# Patient Record
Sex: Male | Born: 1965 | Race: White | Hispanic: No | Marital: Married
Health system: Midwestern US, Academic
[De-identification: ages and names within clinical notes are randomized; demographics above are authoritative.]

## PROBLEM LIST (undated history)

## (undated) DIAGNOSIS — J45909 Unspecified asthma, uncomplicated: Secondary | ICD-10-CM

## (undated) DIAGNOSIS — M19012 Primary osteoarthritis, left shoulder: Secondary | ICD-10-CM

## (undated) DIAGNOSIS — Z8719 Personal history of other diseases of the digestive system: Secondary | ICD-10-CM

## (undated) DIAGNOSIS — J189 Pneumonia, unspecified organism: Secondary | ICD-10-CM

## (undated) DIAGNOSIS — K219 Gastro-esophageal reflux disease without esophagitis: Secondary | ICD-10-CM

## (undated) HISTORY — PX: OTHER SURGICAL HISTORY: SHX169

## (undated) HISTORY — PX: APPENDECTOMY: SHX54

## (undated) HISTORY — PX: BACK SURGERY: SHX140

## (undated) HISTORY — PX: CHOLECYSTECTOMY: SHX55

---

## 2004-10-01 HISTORY — PX: OTHER SURGICAL HISTORY: SHX169

## 2012-10-01 HISTORY — PX: OTHER SURGICAL HISTORY: SHX169

## 2014-10-01 HISTORY — PX: OTHER SURGICAL HISTORY: SHX169

## 2017-03-25 ENCOUNTER — Other Ambulatory Visit: Payer: Self-pay | Admitting: Orthopedic Surgery

## 2017-03-25 DIAGNOSIS — S43432A Superior glenoid labrum lesion of left shoulder, initial encounter: Secondary | ICD-10-CM

## 2017-03-26 ENCOUNTER — Ambulatory Visit
Admission: RE | Admit: 2017-03-26 | Discharge: 2017-03-26 | Disposition: A | Discharge status: Home / Self Care | Payer: Federal, State, Local not specified - PPO | Source: Ambulatory Visit | Attending: Orthopedic Surgery | Admitting: Orthopedic Surgery

## 2017-03-26 DIAGNOSIS — S43432A Superior glenoid labrum lesion of left shoulder, initial encounter: Secondary | ICD-10-CM

## 2017-04-12 ENCOUNTER — Other Ambulatory Visit: Payer: Self-pay | Admitting: Gastroenterology

## 2017-04-12 DIAGNOSIS — K222 Esophageal obstruction: Secondary | ICD-10-CM

## 2017-04-15 ENCOUNTER — Other Ambulatory Visit: Payer: Self-pay | Admitting: Gastroenterology

## 2017-04-17 ENCOUNTER — Ambulatory Visit
Admission: RE | Admit: 2017-04-17 | Discharge: 2017-04-17 | Disposition: A | Discharge status: Home / Self Care | Payer: Federal, State, Local not specified - PPO | Source: Ambulatory Visit | Attending: Gastroenterology | Admitting: Gastroenterology

## 2017-04-17 DIAGNOSIS — K222 Esophageal obstruction: Secondary | ICD-10-CM

## 2017-04-18 ENCOUNTER — Encounter (HOSPITAL_COMMUNITY): Payer: Self-pay | Admitting: *Deleted

## 2017-04-22 ENCOUNTER — Ambulatory Visit (HOSPITAL_COMMUNITY): Payer: Federal, State, Local not specified - PPO | Admitting: Registered Nurse

## 2017-04-22 ENCOUNTER — Encounter (HOSPITAL_COMMUNITY)
Admission: RE | Disposition: A | Discharge status: Home / Self Care | Payer: Self-pay | Source: Ambulatory Visit | Attending: Gastroenterology

## 2017-04-22 ENCOUNTER — Ambulatory Visit (HOSPITAL_COMMUNITY)
Admission: RE | Admit: 2017-04-22 | Discharge: 2017-04-22 | Disposition: A | Discharge status: Home / Self Care | Payer: Federal, State, Local not specified - PPO | Source: Ambulatory Visit | Attending: Gastroenterology | Admitting: Gastroenterology

## 2017-04-22 ENCOUNTER — Encounter (HOSPITAL_COMMUNITY): Payer: Self-pay

## 2017-04-22 DIAGNOSIS — Z79899 Other long term (current) drug therapy: Secondary | ICD-10-CM | POA: Insufficient documentation

## 2017-04-22 DIAGNOSIS — M19012 Primary osteoarthritis, left shoulder: Secondary | ICD-10-CM | POA: Insufficient documentation

## 2017-04-22 DIAGNOSIS — K2960 Other gastritis without bleeding: Secondary | ICD-10-CM | POA: Insufficient documentation

## 2017-04-22 DIAGNOSIS — Z885 Allergy status to narcotic agent status: Secondary | ICD-10-CM | POA: Insufficient documentation

## 2017-04-22 DIAGNOSIS — K222 Esophageal obstruction: Secondary | ICD-10-CM | POA: Diagnosis not present

## 2017-04-22 DIAGNOSIS — K200 Eosinophilic esophagitis: Secondary | ICD-10-CM | POA: Diagnosis not present

## 2017-04-22 DIAGNOSIS — Z91010 Allergy to peanuts: Secondary | ICD-10-CM | POA: Diagnosis not present

## 2017-04-22 DIAGNOSIS — K219 Gastro-esophageal reflux disease without esophagitis: Secondary | ICD-10-CM | POA: Diagnosis not present

## 2017-04-22 DIAGNOSIS — K3189 Other diseases of stomach and duodenum: Secondary | ICD-10-CM | POA: Insufficient documentation

## 2017-04-22 DIAGNOSIS — Z91013 Allergy to seafood: Secondary | ICD-10-CM | POA: Diagnosis not present

## 2017-04-22 DIAGNOSIS — Z91048 Other nonmedicinal substance allergy status: Secondary | ICD-10-CM | POA: Insufficient documentation

## 2017-04-22 HISTORY — DX: Gastro-esophageal reflux disease without esophagitis: K21.9

## 2017-04-22 HISTORY — DX: Unspecified asthma, uncomplicated: J45.909

## 2017-04-22 HISTORY — PX: ESOPHAGOGASTRODUODENOSCOPY: SHX5428

## 2017-04-22 SURGERY — EGD (ESOPHAGOGASTRODUODENOSCOPY)
Anesthesia: Monitor Anesthesia Care

## 2017-04-22 MED ORDER — LIDOCAINE 2% (20 MG/ML) 5 ML SYRINGE
INTRAMUSCULAR | Status: AC
  Filled 2017-04-22: qty 5

## 2017-04-22 MED ORDER — SODIUM CHLORIDE 0.9 % IV SOLN: INTRAVENOUS | Status: DC

## 2017-04-22 MED ORDER — PROPOFOL 500 MG/50ML IV EMUL
INTRAVENOUS | Status: DC | PRN
  Administered 2017-04-22: 130 ug/kg/min via INTRAVENOUS

## 2017-04-22 MED ORDER — PROPOFOL 10 MG/ML IV BOLUS
INTRAVENOUS | Status: AC
  Filled 2017-04-22: qty 40

## 2017-04-22 MED ORDER — LACTATED RINGERS IV SOLN
INTRAVENOUS | Status: DC
  Administered 2017-04-22: 11:00:00 via INTRAVENOUS

## 2017-04-22 MED ORDER — PROPOFOL 10 MG/ML IV BOLUS
INTRAVENOUS | Status: DC | PRN
  Administered 2017-04-22 (×3): 20 mg via INTRAVENOUS

## 2017-04-22 MED ORDER — LIDOCAINE 2% (20 MG/ML) 5 ML SYRINGE
INTRAMUSCULAR | Status: DC | PRN
  Administered 2017-04-22: 100 mg via INTRAVENOUS

## 2017-04-22 NOTE — Op Note (Signed)
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 Estimated Blood Loss:     Estimated blood loss: none. Procedure:                Pre-Anesthesia Assessment:                           - Prior to the procedure, a History and Physical                            was performed, and patient medications and                            allergies were reviewed. The patient's tolerance of                            previous anesthesia was also reviewed. The risks                            and benefits of the procedure and the sedation                            options and risks were discussed with the patient.                            All questions were answered, and informed consent                            was obtained. Prior Anticoagulants: The patient has                            taken no previous anticoagulant or antiplatelet                            agents. ASA Grade Assessment: II - A patient  with                            mild systemic disease. After reviewing the risks                            and benefits, the patient was deemed in                            satisfactory condition to undergo the procedure.                           After obtaining informed consent, the endoscope was                            passed under direct vision. Throughout the                             procedure, the patient's blood pressure, pulse, and                            oxygen saturations were monitored continuously. The                            was introduced through the mouth, and advanced to                            the second part of duodenum. The upper GI endoscopy                            was accomplished without difficulty. The patient                            tolerated the procedure well. Scope In: Scope Out: Findings:      Mucosal changes including ringed esophagus, longitudinal furrows and       white plaques were found in the upper third of the esophagus, in the       middle third of the esophagus and in the lower third of the esophagus.       Esophageal findings were graded using the Eosinophilic Esophagitis       Endoscopic Reference Score (EoE-EREFS) as: Edema Grade 1 Present       (decreased clarity or absence of vascular markings), Rings Grade 2       Moderate (distinct rings that do not occlude passage of diagnostic 8-10       mm endoscope), Exudates Grade 1 Mild (scattered white lesions involving       less than 10 percent of the esophageal surface area), Furrows Grade 1       Present (vertical lines with or without visible depth) and Stricture       present. Biopsies were obtained from the proximal and distal esophagus       with cold forceps for histology of suspected eosinophilic esophagitis. A       TTS dilator was passed through the scope. Dilation with an 18-19-20 mm x       8 cm CRE balloon  dilator was performed to 18 mm and 19 mm. The dilation       site was examined following endoscope reinsertion and showed moderate       improvement in luminal narrowing.      Localized mildly erythematous mucosa without bleeding was found in the       gastric antrum. Biopsies were taken with a cold forceps for Helicobacter       pylori testing.      The cardia and gastric fundus were normal on retroflexion.      The examined  duodenum was normal. Impression:               - Esophageal mucosal changes consistent with                            eosinophilic esophagitis. Biopsied. Dilated.                           - Erythematous mucosa in the antrum. Biopsied.                           - Normal examined duodenum. Moderate Sedation:      Patient did not receive moderate sedation for this procedure, but       instead received monitored anesthesia care. Recommendation:           - Patient has a contact number available for                            emergencies. The signs and symptoms of potential                            delayed complications were discussed with the                            patient. Return to normal activities tomorrow.                            Written discharge instructions were provided to the                            patient.                           - Resume previous diet.                           - Continue present medications.                           - Await pathology results.                           - If biopsies compatible with EOE, will need to                            take PPI BID along with swallowed steroids. Procedure Code(s):        --- Professional ---  [id number], Esophagogastroduodenoscopy, flexible,                            transoral; with transendoscopic balloon dilation of                            esophagus (less than 30 mm diameter)                           43239, Esophagogastroduodenoscopy, flexible,                            transoral; with biopsy, single or multiple Diagnosis Code(s):        --- Professional ---                           K20.9, Esophagitis, unspecified                           K31.89, Other diseases of stomach and duodenum                           R13.10, Dysphagia, unspecified                           K22.2, Esophageal obstruction                           R93.3, Abnormal findings on diagnostic imaging of                             other parts of digestive tract CPT copyright 2016 [employer]. All rights reserved. The codes documented in this report are preliminary and upon coder review may  be revised to meet current compliance requirements. Ronnette Juniper, MD 04/22/2017 12:32:04 PM This report has been signed electronically. Number of Addenda: 0

## 2017-04-22 NOTE — Anesthesia Preprocedure Evaluation (Signed)
Anesthesia Evaluation  Patient identified by MRN, date of birth, ID band Patient awake    Reviewed: Allergy & Precautions, NPO status , Patient's Chart, lab work & pertinent test results  Airway Mallampati: II  TM Distance: >3 FB Neck ROM: Full    Dental no notable dental hx.    Pulmonary neg pulmonary ROS,    Pulmonary exam normal breath sounds clear to auscultation       Cardiovascular negative cardio ROS Normal cardiovascular exam Rhythm:Regular Rate:Normal     Neuro/Psych negative neurological ROS  negative psych ROS   GI/Hepatic Neg liver ROS, GERD  ,  Endo/Other  negative endocrine ROS  Renal/GU negative Renal ROS     Musculoskeletal  (+) Arthritis ,   Abdominal   Peds  Hematology negative hematology ROS (+)   Anesthesia Other Findings   Reproductive/Obstetrics negative OB ROS                             Anesthesia Physical Anesthesia Plan  ASA: II  Anesthesia Plan: MAC   Post-op Pain Management:    Induction: Intravenous  PONV Risk Score and Plan: 1 and Ondansetron, Treatment may vary due to age or medical condition and Propofol  Airway Management Planned:   Additional Equipment:   Intra-op Plan:   Post-operative Plan:   Informed Consent: I have reviewed the patients History and Physical, chart, labs and discussed the procedure including the risks, benefits and alternatives for the proposed anesthesia with the patient or authorized representative who has indicated his/her understanding and acceptance.   Dental advisory given  Plan Discussed with: CRNA  Anesthesia Plan Comments:         Anesthesia Quick Evaluation

## 2017-04-22 NOTE — Op Note (Signed)
EGD showed changes suggestive of eosinophilic esophagitis. Esophageal mucosa had whitish plaques, longitudinal furrows and ringed appearance. Biopsies were taken from different areas of esophagus to rule out eosinophilic esophagitis. At 38 cm and distal esophagus, obvious narrowing was noted, but the scope could be advanced into the gastric cavity with minimal resistance. This was dilated with a CRE balloon, 18 mm for a minute, 19 mm for 23 seconds, before noticing obvious mucosal tear and minimal bleeding. Biopsies were taken from antrum to rule out H. Pylori. Retroflexion appeared unremarkable. Duodenum appeared unremarkable.  If the biopsies are compatible with eosinophilic esophagitis, she will need PPI twice a day along with swallowed steroids.  Ronnette Juniper, M.D.

## 2017-04-22 NOTE — H&P (Signed)
Bobby Cole is an 51 y.o. male.   Chief Complaint: Dysphagia HPI: 51 year old male with history of esophageal stricture, multiple dilatations in the past in [place] and also in [place], presents with difficulty swallowing. He has been intermittently symptomatic for over 20 years. As history of hiatal hernia and gastroesophageal reflux disease. Used to be on swallowed steroids. Asked endoscopy was 2 years ago. 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  Past Medical History:  Diagnosis Date  . Asthma    exercise induced asthma no inhalers used  . DJD of left shoulder   . GERD (gastroesophageal reflux disease)     Past Surgical History:  Procedure Laterality Date  . APPENDECTOMY    . BACK SURGERY     cervical fusion C 4 thru C7  . CHOLECYSTECTOMY    . left acl repair    . left calf fasciotomy     compartment syndrome  . left forearm surgery for osteomyelitis    . left shoulder partial replacement Left 2006   hemicap  . left wrist carpel tunnel surgery    . left wrist fusion with traction    . lower back fusion L 4 to S 1   2016  . multipl e knee meniscus repairs Bilateral   . partial amputation right pinkie    . pectoral and long head bicep repair Right   . right hip femoral surgery  2014    History reviewed. No pertinent family history. Social History:  reports that he has never smoked. He has never used smokeless tobacco. He reports that he drinks alcohol. He reports that he does not use drugs.  Allergies:  Allergies  Allergen Reactions  . Fish Allergy Anaphylaxis  . Codeine Nausea Only    Medications Prior to Admission  Medication Sig Dispense Refill  . ibuprofen (ADVIL,MOTRIN) 200 MG tablet Take 400-800 mg by mouth every 6 (six) hours as needed for headache, mild pain or moderate pain.    Marland Kitchen omeprazole (PRILOSEC OTC) 20 MG tablet Take  20 mg by mouth 2 (two) times daily.    . pravastatin (PRAVACHOL) 40 MG tablet Take 40 mg by mouth every evening.  0  . testosterone cypionate (DEPOTESTOSTERONE CYPIONATE) 200 MG/ML injection Inject 200 mg into the muscle once a week. Saturdays.  0  . anastrozole (ARIMIDEX) 1 MG tablet Take 1 mg by mouth daily.  3    No results found for this or any previous visit (from the past 48 hour(s)). No results found.  ROS  All 10 systems were reviewed and there are no pertinent positives, except for what was mentioned in history of present illness.  Blood pressure (!) 159/93, pulse 75, temperature 97.6 F (36.4 C), temperature source Oral, resp. rate 14, height 5\' 11"  (1.803 m), weight 107.5 kg (237 lb), SpO2 100 %. Physical Exam  Gen.: Not in acute distress Lungs: Biateral vascular breath sound Heart: Regular rate rhythm Abdomen: Soft nondistended normoactive bowel sounds Neuro: Alert awake oriented 3    Assessment/Plan Dysphagia History of esophageal stricture History of eosinophilic esophagitis   EGD with dilatation, esophageal biopsies   Ronnette Juniper, MD 04/22/2017, 11:12 AM

## 2017-04-22 NOTE — Transfer of Care (Signed)
Immediate Anesthesia Transfer of Care Note  Patient: Bobby Cole  Procedure(s) Performed: Procedure(s): ESOPHAGOGASTRODUODENOSCOPY (EGD) (N/A)  Patient Location: PACU and Endoscopy Unit  Anesthesia Type:MAC  Level of Consciousness: awake, alert , oriented and patient cooperative  Airway & Oxygen Therapy: Patient Spontanous Breathing and Patient connected to nasal cannula oxygen  Post-op Assessment: Report given to RN, Post -op Vital signs reviewed and stable and Patient moving all extremities  Post vital signs: Reviewed and stable  Last Vitals:  Vitals:   04/22/17 1102  BP: (!) 159/93  Pulse: 75  Resp: 14  Temp: 36.4 C    Last Pain:  Vitals:   04/22/17 1102  TempSrc: Oral         Complications: No apparent anesthesia complications

## 2017-04-22 NOTE — Brief Op Note (Signed)
04/22/2017  12:32 PM  PATIENT:  Bobby Cole  51 y.o. male  PRE-OPERATIVE DIAGNOSIS:  Eosinophilic esophagitis  POST-OPERATIVE DIAGNOSIS:  esophageal dilation  PROCEDURE:  Procedure(s): ESOPHAGOGASTRODUODENOSCOPY (EGD) (N/A)  SURGEON:  Surgeon(s) and Role:    Ronnette Juniper, MD - Primary  PHYSICIAN ASSISTANT:   ASSISTANTS: none   ANESTHESIA:   MAC  EBL:  No intake/output data recorded.  BLOOD ADMINISTERED:none  DRAINS: none   LOCAL MEDICATIONS USED:  NONE  SPECIMEN:  Biopsy / Limited Resection  DISPOSITION OF SPECIMEN:  PATHOLOGY  COUNTS:  YES  TOURNIQUET:  * No tourniquets in log *  DICTATION: .Dragon Dictation  PLAN OF CARE: Discharge to home after PACU  PATIENT DISPOSITION:  PACU - hemodynamically stable.   Delay start of Pharmacological VTE agent (>24hrs) due to surgical blood loss or risk of bleeding: no

## 2017-04-22 NOTE — Discharge Instructions (Signed)

## 2017-04-23 NOTE — Anesthesia Postprocedure Evaluation (Addendum)
Anesthesia Post Note  Patient: Bobby Cole  Procedure(s) Performed: Procedure(s) (LRB): ESOPHAGOGASTRODUODENOSCOPY (EGD) (N/A)     Patient location during evaluation: PACU Anesthesia Type: MAC Level of consciousness: awake and alert Pain management: pain level controlled Vital Signs Assessment: post-procedure vital signs reviewed and stable Respiratory status: spontaneous breathing Cardiovascular status: stable Anesthetic complications: no    Last Vitals:  Vitals:   04/22/17 1240 04/22/17 1250  BP: (!) 145/87 (!) 142/81  Pulse: 74 82  Resp: 20 (!) 21  Temp:      Last Pain:  Vitals:   04/22/17 1235  TempSrc: Oral                 Nolon Nations

## 2017-04-25 ENCOUNTER — Encounter (HOSPITAL_COMMUNITY): Payer: Self-pay | Admitting: Gastroenterology

## 2017-04-26 ENCOUNTER — Other Ambulatory Visit: Payer: Self-pay | Admitting: Orthopedic Surgery

## 2017-04-26 ENCOUNTER — Encounter (HOSPITAL_COMMUNITY)
Admission: RE | Admit: 2017-04-26 | Discharge: 2017-04-26 | Disposition: A | Discharge status: Home / Self Care | Payer: Federal, State, Local not specified - PPO | Source: Ambulatory Visit | Attending: Orthopedic Surgery | Admitting: Orthopedic Surgery

## 2017-04-26 ENCOUNTER — Encounter (HOSPITAL_COMMUNITY): Payer: Self-pay

## 2017-04-26 DIAGNOSIS — Z01812 Encounter for preprocedural laboratory examination: Secondary | ICD-10-CM | POA: Insufficient documentation

## 2017-04-26 DIAGNOSIS — R1310 Dysphagia, unspecified: Secondary | ICD-10-CM | POA: Diagnosis not present

## 2017-04-26 HISTORY — DX: Pneumonia, unspecified organism: J18.9

## 2017-04-26 HISTORY — DX: Personal history of other diseases of the digestive system: Z87.19

## 2017-04-26 LAB — CBC
HEMATOCRIT: 46.6 % (ref 39.0–52.0)
HEMOGLOBIN: 15.9 g/dL (ref 13.0–17.0)
MCH: 30.8 pg (ref 26.0–34.0)
MCHC: 34.1 g/dL (ref 30.0–36.0)
MCV: 90.3 fL (ref 78.0–100.0)
PLATELETS: 187 10*3/uL (ref 150–400)
RBC: 5.16 MIL/uL (ref 4.22–5.81)
RDW: 14.1 % (ref 11.5–15.5)
WBC: 6.9 10*3/uL (ref 4.0–10.5)

## 2017-04-26 LAB — SURGICAL PCR SCREEN
MRSA, PCR: NEGATIVE
STAPHYLOCOCCUS AUREUS: NEGATIVE
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
## 2017-04-26 NOTE — Pre-Procedure Instructions (Signed)
    Bobby Cole  04/26/2017      [id number] - [place], [place] - [address] [place] [address] Phone: [phone] Fax: [phone]    Your procedure is scheduled on Tues., Aug 7  Report to [facility] Admitting at 8:45 A.M.  Call this number if you have problems the morning of surgery:  [phone]   Remember:  Do not eat food or drink liquids after midnight.  Take these medicines the morning of surgery with A SIP OF WATER : omeprazole (prilosec)                 1 week prior to surgery stop: advil, motrin, ibuprofen, aleve, BC Powders, , Goody's, herbal medicines and vitamins.   Do not wear jewelry.  Do not wear lotions, powders, or perfumes, or deoderant.  Do not shave 48 hours prior to surgery.  Men may shave face and neck.  Do not bring valuables to the hospital.  [facility] is not responsible for any belongings or valuables.  Contacts, dentures or bridgework may not be worn into surgery.  Leave your suitcase in the car.  After surgery it may be brought to your room.  For patients admitted to the hospital, discharge time will be determined by your treatment team.  Patients discharged the day of surgery will not be allowed to drive home.    Special instructions:  Review handouts  Please read over the following fact sheets that you were given. Coughing and Deep Breathing, MRSA Information and Surgical Site Infection Prevention

## 2017-05-07 ENCOUNTER — Inpatient Hospital Stay (HOSPITAL_COMMUNITY): Payer: Federal, State, Local not specified - PPO | Admitting: Anesthesiology

## 2017-05-07 ENCOUNTER — Encounter (HOSPITAL_COMMUNITY): Payer: Self-pay | Admitting: Anesthesiology

## 2017-05-07 ENCOUNTER — Encounter (HOSPITAL_COMMUNITY)
Admission: RE | Disposition: A | Discharge status: Home / Self Care | Payer: Self-pay | Source: Ambulatory Visit | Attending: Orthopedic Surgery

## 2017-05-07 ENCOUNTER — Inpatient Hospital Stay (HOSPITAL_COMMUNITY): Payer: Federal, State, Local not specified - PPO

## 2017-05-07 ENCOUNTER — Inpatient Hospital Stay (HOSPITAL_COMMUNITY)
Admission: RE | Admit: 2017-05-07 | Discharge: 2017-05-08 | DRG: 483 | Disposition: A | Discharge status: Home / Self Care | Payer: Federal, State, Local not specified - PPO | Source: Ambulatory Visit | Attending: Orthopedic Surgery | Admitting: Orthopedic Surgery

## 2017-05-07 DIAGNOSIS — K449 Diaphragmatic hernia without obstruction or gangrene: Secondary | ICD-10-CM | POA: Diagnosis present

## 2017-05-07 DIAGNOSIS — J45990 Exercise induced bronchospasm: Secondary | ICD-10-CM | POA: Diagnosis present

## 2017-05-07 DIAGNOSIS — Z91013 Allergy to seafood: Secondary | ICD-10-CM

## 2017-05-07 DIAGNOSIS — Z885 Allergy status to narcotic agent status: Secondary | ICD-10-CM

## 2017-05-07 DIAGNOSIS — M19012 Primary osteoarthritis, left shoulder: Secondary | ICD-10-CM | POA: Diagnosis present

## 2017-05-07 DIAGNOSIS — T8484XA Pain due to internal orthopedic prosthetic devices, implants and grafts, initial encounter: Secondary | ICD-10-CM | POA: Diagnosis present

## 2017-05-07 DIAGNOSIS — K219 Gastro-esophageal reflux disease without esophagitis: Secondary | ICD-10-CM | POA: Diagnosis present

## 2017-05-07 DIAGNOSIS — Y838 Other surgical procedures as the cause of abnormal reaction of the patient, or of later complication, without mention of misadventure at the time of the procedure: Secondary | ICD-10-CM | POA: Diagnosis present

## 2017-05-07 DIAGNOSIS — Z96619 Presence of unspecified artificial shoulder joint: Secondary | ICD-10-CM

## 2017-05-07 DIAGNOSIS — Z79899 Other long term (current) drug therapy: Secondary | ICD-10-CM | POA: Diagnosis not present

## 2017-05-07 HISTORY — PX: TOTAL SHOULDER REVISION: SHX6130

## 2017-05-07 HISTORY — DX: Primary osteoarthritis, left shoulder: M19.012

## 2017-05-07 SURGERY — REVISION, TOTAL ARTHROPLASTY, SHOULDER
Anesthesia: General | Site: Shoulder | Laterality: Left

## 2017-05-07 MED ORDER — DEXAMETHASONE SODIUM PHOSPHATE 10 MG/ML IJ SOLN
INTRAMUSCULAR | Status: AC
  Filled 2017-05-07: qty 1

## 2017-05-07 MED ORDER — ROCURONIUM BROMIDE 100 MG/10ML IV SOLN
INTRAVENOUS | Status: DC | PRN
  Administered 2017-05-07: 10 mg via INTRAVENOUS
  Administered 2017-05-07: 70 mg via INTRAVENOUS
  Administered 2017-05-07: 20 mg via INTRAVENOUS

## 2017-05-07 MED ORDER — ACETAMINOPHEN 650 MG RE SUPP: 650.0000 mg | Freq: Four times a day (QID) | RECTAL | Status: DC | PRN

## 2017-05-07 MED ORDER — FENTANYL CITRATE (PF) 250 MCG/5ML IJ SOLN
INTRAMUSCULAR | Status: AC
  Filled 2017-05-07: qty 5

## 2017-05-07 MED ORDER — CEFAZOLIN SODIUM-DEXTROSE 2-4 GM/100ML-% IV SOLN
INTRAVENOUS | Status: AC
  Filled 2017-05-07: qty 100

## 2017-05-07 MED ORDER — PRAVASTATIN SODIUM 40 MG PO TABS
40.0000 mg | ORAL_TABLET | Freq: Every evening | ORAL | Status: DC
  Administered 2017-05-07: 40 mg via ORAL
  Filled 2017-05-07: qty 1

## 2017-05-07 MED ORDER — OXYCODONE HCL 5 MG PO TABS: 5.0000 mg | ORAL_TABLET | ORAL | 0 refills | Status: AC | PRN

## 2017-05-07 MED ORDER — BUPIVACAINE HCL (PF) 0.25 % IJ SOLN
INTRAMUSCULAR | Status: AC
  Filled 2017-05-07: qty 30

## 2017-05-07 MED ORDER — ONDANSETRON HCL 4 MG/2ML IJ SOLN
INTRAMUSCULAR | Status: AC
  Filled 2017-05-07: qty 2

## 2017-05-07 MED ORDER — LACTATED RINGERS IV SOLN
INTRAVENOUS | Status: DC | PRN
  Administered 2017-05-07 (×2): via INTRAVENOUS

## 2017-05-07 MED ORDER — DOCUSATE SODIUM 100 MG PO CAPS
100.0000 mg | ORAL_CAPSULE | Freq: Two times a day (BID) | ORAL | Status: DC
  Administered 2017-05-07 – 2017-05-08 (×2): 100 mg via ORAL
  Filled 2017-05-07 (×2): qty 1

## 2017-05-07 MED ORDER — PROPOFOL 10 MG/ML IV BOLUS
INTRAVENOUS | Status: AC
  Filled 2017-05-07: qty 20

## 2017-05-07 MED ORDER — METHOCARBAMOL 500 MG PO TABS
500.0000 mg | ORAL_TABLET | Freq: Four times a day (QID) | ORAL | Status: DC | PRN
  Administered 2017-05-07 – 2017-05-08 (×2): 500 mg via ORAL
  Filled 2017-05-07 (×2): qty 1

## 2017-05-07 MED ORDER — ONDANSETRON HCL 4 MG/2ML IJ SOLN: 4.0000 mg | Freq: Once | INTRAMUSCULAR | Status: DC | PRN

## 2017-05-07 MED ORDER — METOCLOPRAMIDE HCL 5 MG/ML IJ SOLN: 5.0000 mg | Freq: Three times a day (TID) | INTRAMUSCULAR | Status: DC | PRN

## 2017-05-07 MED ORDER — ONDANSETRON HCL 4 MG PO TABS: 4.0000 mg | ORAL_TABLET | Freq: Four times a day (QID) | ORAL | Status: DC | PRN

## 2017-05-07 MED ORDER — SENNA 8.6 MG PO TABS
1.0000 | ORAL_TABLET | Freq: Two times a day (BID) | ORAL | Status: DC
  Administered 2017-05-08: 8.6 mg via ORAL
  Filled 2017-05-07: qty 1

## 2017-05-07 MED ORDER — DEXAMETHASONE SODIUM PHOSPHATE 10 MG/ML IJ SOLN
INTRAMUSCULAR | Status: DC | PRN
  Administered 2017-05-07: 10 mg via INTRAVENOUS

## 2017-05-07 MED ORDER — FENTANYL CITRATE (PF) 100 MCG/2ML IJ SOLN
100.0000 ug | Freq: Once | INTRAMUSCULAR | Status: AC
  Administered 2017-05-07: 100 ug via INTRAVENOUS

## 2017-05-07 MED ORDER — LIDOCAINE 2% (20 MG/ML) 5 ML SYRINGE
INTRAMUSCULAR | Status: AC
  Filled 2017-05-07: qty 5

## 2017-05-07 MED ORDER — PHENOL 1.4 % MT LIQD: 1.0000 | OROMUCOSAL | Status: DC | PRN

## 2017-05-07 MED ORDER — CEFAZOLIN SODIUM-DEXTROSE 2-4 GM/100ML-% IV SOLN
2.0000 g | INTRAVENOUS | Status: AC
  Administered 2017-05-07: 2 g via INTRAVENOUS

## 2017-05-07 MED ORDER — 0.9 % SODIUM CHLORIDE (POUR BTL) OPTIME
TOPICAL | Status: DC | PRN
  Administered 2017-05-07: 1000 mL

## 2017-05-07 MED ORDER — DIPHENHYDRAMINE HCL 12.5 MG/5ML PO ELIX: 12.5000 mg | ORAL_SOLUTION | ORAL | Status: DC | PRN

## 2017-05-07 MED ORDER — OXYCODONE HCL 5 MG PO TABS
5.0000 mg | ORAL_TABLET | ORAL | Status: DC | PRN
  Administered 2017-05-07: 5 mg via ORAL
  Administered 2017-05-07: 10 mg via ORAL
  Filled 2017-05-07: qty 2
  Filled 2017-05-07: qty 1

## 2017-05-07 MED ORDER — SENNA-DOCUSATE SODIUM 8.6-50 MG PO TABS: 2.0000 | ORAL_TABLET | Freq: Every day | ORAL | 1 refills | Status: AC

## 2017-05-07 MED ORDER — DEXMEDETOMIDINE HCL 200 MCG/2ML IV SOLN
INTRAVENOUS | Status: DC | PRN
  Administered 2017-05-07 (×2): 28 ug via INTRAVENOUS

## 2017-05-07 MED ORDER — FENTANYL CITRATE (PF) 100 MCG/2ML IJ SOLN
INTRAMUSCULAR | Status: AC
  Administered 2017-05-07: 100 ug via INTRAVENOUS
  Filled 2017-05-07: qty 2

## 2017-05-07 MED ORDER — MIDAZOLAM HCL 2 MG/2ML IJ SOLN
INTRAMUSCULAR | Status: AC
  Administered 2017-05-07: 2 mg via INTRAVENOUS
  Filled 2017-05-07: qty 2

## 2017-05-07 MED ORDER — PROPOFOL 10 MG/ML IV BOLUS
INTRAVENOUS | Status: DC | PRN
Start: 2017-05-07 — End: 2017-05-07
  Administered 2017-05-07: 200 mg via INTRAVENOUS

## 2017-05-07 MED ORDER — ONDANSETRON HCL 4 MG/2ML IJ SOLN
INTRAMUSCULAR | Status: DC | PRN
  Administered 2017-05-07: 4 mg via INTRAVENOUS

## 2017-05-07 MED ORDER — ZOLPIDEM TARTRATE 5 MG PO TABS: 5.0000 mg | ORAL_TABLET | Freq: Every evening | ORAL | Status: DC | PRN

## 2017-05-07 MED ORDER — MEPERIDINE HCL 25 MG/ML IJ SOLN: 6.2500 mg | INTRAMUSCULAR | Status: DC | PRN

## 2017-05-07 MED ORDER — FENTANYL CITRATE (PF) 100 MCG/2ML IJ SOLN: 25.0000 ug | INTRAMUSCULAR | Status: DC | PRN

## 2017-05-07 MED ORDER — POTASSIUM CHLORIDE IN NACL 20-0.45 MEQ/L-% IV SOLN
INTRAVENOUS | Status: DC
  Administered 2017-05-07: 19:00:00 via INTRAVENOUS
  Filled 2017-05-07 (×2): qty 1000

## 2017-05-07 MED ORDER — FENTANYL CITRATE (PF) 100 MCG/2ML IJ SOLN
INTRAMUSCULAR | Status: DC | PRN
  Administered 2017-05-07: 100 ug via INTRAVENOUS
  Administered 2017-05-07: 50 ug via INTRAVENOUS
  Administered 2017-05-07 (×2): 100 ug via INTRAVENOUS
  Administered 2017-05-07: 150 ug via INTRAVENOUS
  Administered 2017-05-07 (×2): 100 ug via INTRAVENOUS

## 2017-05-07 MED ORDER — TESTOSTERONE CYPIONATE 200 MG/ML IM SOLN: 200.0000 mg | INTRAMUSCULAR | Status: DC

## 2017-05-07 MED ORDER — ACETAMINOPHEN 325 MG PO TABS
650.0000 mg | ORAL_TABLET | Freq: Four times a day (QID) | ORAL | Status: DC | PRN
  Administered 2017-05-07 – 2017-05-08 (×2): 650 mg via ORAL
  Filled 2017-05-07 (×2): qty 2

## 2017-05-07 MED ORDER — PANTOPRAZOLE SODIUM 40 MG PO TBEC
40.0000 mg | DELAYED_RELEASE_TABLET | Freq: Two times a day (BID) | ORAL | Status: DC
  Administered 2017-05-07 – 2017-05-08 (×2): 40 mg via ORAL
  Filled 2017-05-07 (×2): qty 1

## 2017-05-07 MED ORDER — POLYETHYLENE GLYCOL 3350 17 G PO PACK: 17.0000 g | PACK | Freq: Every day | ORAL | Status: DC | PRN

## 2017-05-07 MED ORDER — DEXMEDETOMIDINE HCL IN NACL 200 MCG/50ML IV SOLN
INTRAVENOUS | Status: AC
  Filled 2017-05-07: qty 50

## 2017-05-07 MED ORDER — METOCLOPRAMIDE HCL 5 MG PO TABS: 5.0000 mg | ORAL_TABLET | Freq: Three times a day (TID) | ORAL | Status: DC | PRN

## 2017-05-07 MED ORDER — SUGAMMADEX SODIUM 500 MG/5ML IV SOLN
INTRAVENOUS | Status: DC | PRN
  Administered 2017-05-07: 300 mg via INTRAVENOUS

## 2017-05-07 MED ORDER — DEXTROSE 5 % IV SOLN
500.0000 mg | Freq: Four times a day (QID) | INTRAVENOUS | Status: DC | PRN
  Filled 2017-05-07: qty 5

## 2017-05-07 MED ORDER — ROPIVACAINE HCL 5 MG/ML IJ SOLN
INTRAMUSCULAR | Status: DC | PRN
  Administered 2017-05-07: 30 mL via PERINEURAL

## 2017-05-07 MED ORDER — ONDANSETRON HCL 4 MG/2ML IJ SOLN: 4.0000 mg | Freq: Four times a day (QID) | INTRAMUSCULAR | Status: DC | PRN

## 2017-05-07 MED ORDER — MENTHOL 3 MG MT LOZG: 1.0000 | LOZENGE | OROMUCOSAL | Status: DC | PRN

## 2017-05-07 MED ORDER — CEFAZOLIN SODIUM-DEXTROSE 1-4 GM/50ML-% IV SOLN
1.0000 g | Freq: Four times a day (QID) | INTRAVENOUS | Status: AC
  Administered 2017-05-07 – 2017-05-08 (×3): 1 g via INTRAVENOUS
  Filled 2017-05-07 (×3): qty 50

## 2017-05-07 MED ORDER — OMEPRAZOLE MAGNESIUM 20 MG PO TBEC
20.0000 mg | DELAYED_RELEASE_TABLET | Freq: Two times a day (BID) | ORAL | Status: DC
  Filled 2017-05-07: qty 1

## 2017-05-07 MED ORDER — PHENYLEPHRINE 40 MCG/ML (10ML) SYRINGE FOR IV PUSH (FOR BLOOD PRESSURE SUPPORT)
PREFILLED_SYRINGE | INTRAVENOUS | Status: AC
  Filled 2017-05-07: qty 10

## 2017-05-07 MED ORDER — MIDAZOLAM HCL 2 MG/2ML IJ SOLN
2.0000 mg | Freq: Once | INTRAMUSCULAR | Status: AC
  Administered 2017-05-07: 2 mg via INTRAVENOUS

## 2017-05-07 MED ORDER — BISACODYL 10 MG RE SUPP: 10.0000 mg | Freq: Every day | RECTAL | Status: DC | PRN

## 2017-05-07 MED ORDER — MIDAZOLAM HCL 2 MG/2ML IJ SOLN
INTRAMUSCULAR | Status: AC
  Filled 2017-05-07: qty 2

## 2017-05-07 MED ORDER — PHENYLEPHRINE HCL 10 MG/ML IJ SOLN
INTRAVENOUS | Status: DC | PRN
  Administered 2017-05-07: 25 ug/min via INTRAVENOUS

## 2017-05-07 MED ORDER — SODIUM CHLORIDE 0.9 % IR SOLN
Status: DC | PRN
  Administered 2017-05-07: 1000 mL

## 2017-05-07 MED ORDER — MAGNESIUM CITRATE PO SOLN: 1.0000 | Freq: Once | ORAL | Status: DC | PRN

## 2017-05-07 MED ORDER — LIDOCAINE HCL (CARDIAC) 20 MG/ML IV SOLN
INTRAVENOUS | Status: DC | PRN
  Administered 2017-05-07: 100 mg via INTRAVENOUS

## 2017-05-07 MED ORDER — BACLOFEN 10 MG PO TABS: 10.0000 mg | ORAL_TABLET | Freq: Three times a day (TID) | ORAL | 0 refills | Status: AC

## 2017-05-07 MED ORDER — HYDROMORPHONE HCL 1 MG/ML IJ SOLN: 0.5000 mg | INTRAMUSCULAR | Status: DC | PRN

## 2017-05-07 MED ORDER — ONDANSETRON HCL 4 MG PO TABS: 4.0000 mg | ORAL_TABLET | Freq: Three times a day (TID) | ORAL | 0 refills | Status: AC | PRN

## 2017-05-07 MED ORDER — ALUM & MAG HYDROXIDE-SIMETH 200-200-20 MG/5ML PO SUSP: 30.0000 mL | ORAL | Status: DC | PRN

## 2017-05-07 SURGICAL SUPPLY — 72 items
BENZOIN TINCTURE PRP APPL 2/3 (GAUZE/BANDAGES/DRESSINGS) IMPLANT
BLADE SAGITTAL (BLADE) ×1
BLADE SAW SAG 29X58X.64 (BLADE) ×2 IMPLANT
BLADE SAW THK.89X75X18XSGTL (BLADE) ×1 IMPLANT
BOOTCOVER CLEANROOM LRG (PROTECTIVE WEAR) IMPLANT
BOWL SMART MIX CTS (DISPOSABLE) ×2 IMPLANT
BRUSH FEMORAL CANAL (MISCELLANEOUS) IMPLANT
CAPT SHLDR TOTAL 2 ×2 IMPLANT
CEMENT BONE DEPUY (Cement) ×2 IMPLANT
CLSR STERI-STRIP ANTIMIC 1/2X4 (GAUZE/BANDAGES/DRESSINGS) ×2 IMPLANT
COVER BACK TABLE 60X90IN (DRAPES) IMPLANT
COVER SURGICAL LIGHT HANDLE (MISCELLANEOUS) ×2 IMPLANT
DRAPE C-ARM 42X72 X-RAY (DRAPES) IMPLANT
DRAPE INCISE IOBAN 66X45 STRL (DRAPES) ×2 IMPLANT
DRAPE ORTHO SPLIT 77X108 STRL (DRAPES) ×2
DRAPE SURG ORHT 6 SPLT 77X108 (DRAPES) ×2 IMPLANT
DRAPE U-SHAPE 47X51 STRL (DRAPES) ×2 IMPLANT
DRSG MEPILEX BORDER 4X8 (GAUZE/BANDAGES/DRESSINGS) ×2 IMPLANT
DRSG PAD ABDOMINAL 8X10 ST (GAUZE/BANDAGES/DRESSINGS) IMPLANT
DURAPREP 26ML APPLICATOR (WOUND CARE) ×2 IMPLANT
ELECT BLADE 6.5 EXT (BLADE) ×2 IMPLANT
ELECT NEEDLE TIP 2.8 STRL (NEEDLE) ×2 IMPLANT
ELECT REM PT RETURN 9FT ADLT (ELECTROSURGICAL) ×2
ELECTRODE REM PT RTRN 9FT ADLT (ELECTROSURGICAL) ×1 IMPLANT
EVACUATOR 1/8 PVC DRAIN (DRAIN) IMPLANT
FACESHIELD WRAPAROUND (MASK) ×2 IMPLANT
GAUZE SPONGE 4X4 12PLY STRL (GAUZE/BANDAGES/DRESSINGS) IMPLANT
GLOVE BIOGEL PI IND STRL 8 (GLOVE) ×1 IMPLANT
GLOVE BIOGEL PI INDICATOR 8 (GLOVE) ×1
GLOVE BIOGEL PI ORTHO PRO SZ8 (GLOVE) ×1
GLOVE ORTHO TXT STRL SZ7.5 (GLOVE) ×2 IMPLANT
GLOVE PI ORTHO PRO STRL SZ8 (GLOVE) ×1 IMPLANT
GLOVE SURG ORTHO 8.0 STRL STRW (GLOVE) ×4 IMPLANT
GOWN STRL REUS W/ TWL LRG LVL3 (GOWN DISPOSABLE) ×2 IMPLANT
GOWN STRL REUS W/TWL LRG LVL3 (GOWN DISPOSABLE) ×2
HANDPIECE INTERPULSE COAX TIP (DISPOSABLE) ×1
HOOD PEEL AWAY FACE SHEILD DIS (HOOD) ×4 IMPLANT
IV NS 1000ML (IV SOLUTION) ×1
IV NS 1000ML BAXH (IV SOLUTION) ×1 IMPLANT
KIT BASIN OR (CUSTOM PROCEDURE TRAY) ×2 IMPLANT
KIT ROOM TURNOVER OR (KITS) ×2 IMPLANT
MANIFOLD NEPTUNE II (INSTRUMENTS) ×2 IMPLANT
NEEDLE 1/2 CIR CATGUT .05X1.09 (NEEDLE) ×2 IMPLANT
NEEDLE HYPO 25GX1X1/2 BEV (NEEDLE) ×2 IMPLANT
NS IRRIG 1000ML POUR BTL (IV SOLUTION) ×2 IMPLANT
PACK SHOULDER (CUSTOM PROCEDURE TRAY) ×2 IMPLANT
PAD ARMBOARD 7.5X6 YLW CONV (MISCELLANEOUS) ×2 IMPLANT
PIN HUMERAL STMN 3.2MMX9IN (INSTRUMENTS) ×2 IMPLANT
PIN STEINMANN THREADED TIP (PIN) ×2 IMPLANT
RETRIEVER SUT HEWSON (MISCELLANEOUS) ×2 IMPLANT
SET HNDPC FAN SPRY TIP SCT (DISPOSABLE) ×1 IMPLANT
SLING ARM IMMOBILIZER LRG (SOFTGOODS) ×4 IMPLANT
SLING ARM IMMOBILIZER MED (SOFTGOODS) IMPLANT
SMARTMIX MINI TOWER (MISCELLANEOUS) ×2
SPONGE LAP 18X18 X RAY DECT (DISPOSABLE) ×2 IMPLANT
SUCTION FRAZIER HANDLE 10FR (MISCELLANEOUS)
SUCTION TUBE FRAZIER 10FR DISP (MISCELLANEOUS) IMPLANT
SUPPORT WRAP ARM LG (MISCELLANEOUS) ×2 IMPLANT
SUT FIBERWIRE #2 38 T-5 BLUE (SUTURE) ×6
SUT MAXBRAID (SUTURE) ×10 IMPLANT
SUT VIC AB 0 CT1 18XCR BRD 8 (SUTURE) ×1 IMPLANT
SUT VIC AB 0 CT1 8-18 (SUTURE) ×1
SUT VIC AB 2-0 CT1 27 (SUTURE) ×1
SUT VIC AB 2-0 CT1 TAPERPNT 27 (SUTURE) ×1 IMPLANT
SUT VIC AB 3-0 SH 8-18 (SUTURE) ×2 IMPLANT
SUTURE FIBERWR #2 38 T-5 BLUE (SUTURE) ×3 IMPLANT
SYR CONTROL 10ML LL (SYRINGE) ×2 IMPLANT
TOWEL OR 17X24 6PK STRL BLUE (TOWEL DISPOSABLE) ×2 IMPLANT
TOWEL OR 17X26 10 PK STRL BLUE (TOWEL DISPOSABLE) ×2 IMPLANT
TOWER SMARTMIX MINI (MISCELLANEOUS) ×1 IMPLANT
TRAY FOLEY W/METER SILVER 16FR (SET/KITS/TRAYS/PACK) IMPLANT
WATER STERILE IRR 1000ML POUR (IV SOLUTION) IMPLANT

## 2017-05-07 NOTE — Progress Notes (Signed)
Report given to ronda rn as caregiver 

## 2017-05-07 NOTE — Anesthesia Procedure Notes (Addendum)
Anesthesia Regional Block: Interscalene brachial plexus block   Pre-Anesthetic Checklist: ,, timeout performed, Correct Patient, Correct Site, Correct Laterality, Correct Procedure, Correct Position, site marked, Risks and benefits discussed,  Surgical consent,  Pre-op evaluation,  At surgeon's request and post-op pain management  Laterality: Left  Prep: chloraprep       Needles:  Injection technique: Single-shot  Needle Type: Echogenic Stimulator Needle     Needle Length: 5cm  Needle Gauge: 22     Additional Needles:   Procedures: ultrasound guided, nerve stimulator,,,,,,   Nerve Stimulator or Paresthesia:  Response: deltoid, 0.45 mA,   Additional Responses:   Narrative:  Start time: 05/07/2017 10:15 AM End time: 05/07/2017 10:23 AM Injection made incrementally with aspirations every 5 mL.  Performed by: Personally  Anesthesiologist: Genevie Elman  Additional Notes: Functioning IV was confirmed and monitors were applied.  A 46mm 22ga Arrow echogenic stimulator needle was used. Sterile prep and drape,hand hygiene and sterile gloves were used. Ultrasound guidance: relevant anatomy identified, needle position confirmed, local anesthetic spread visualized around nerve(s)., vascular puncture avoided.  Image printed for medical record. Negative aspiration and negative test dose prior to incremental administration of local anesthetic. The patient tolerated the procedure well.

## 2017-05-07 NOTE — Discharge Instructions (Signed)
Diet: As you were doing prior to hospitalization   Shower:  May shower but keep the wounds dry, use an occlusive plastic wrap, NO SOAKING IN TUB.  If the bandage gets wet, change with a clean dry gauze.  If you have a splint on, leave the splint in place and keep the splint dry with a plastic bag.  Dressing:  You may change your dressing 3-5 days after surgery, unless you have a splint.  If you have a splint, then just leave the splint in place and we will change your bandages during your first follow-up appointment.    If you had hand or foot surgery, we will plan to remove your stitches in about 2 weeks in the office.  For all other surgeries, there are sticky tapes (steri-strips) on your wounds and all the stitches are absorbable.  Leave the steri-strips in place when changing your dressings, they will peel off with time, usually 2-3 weeks.  Activity:  Increase activity slowly as tolerated, but follow the weight bearing instructions below.  The rules on driving is that you can not be taking narcotics while you drive, and you must feel in control of the vehicle.    Weight Bearing:   Sling at all times, DO NOT EXTERNALLY ROTATE ARM.    To prevent constipation: you may use a stool softener such as -  Colace (over the counter) 100 mg by mouth twice a day  Drink plenty of fluids (prune juice may be helpful) and high fiber foods Miralax (over the counter) for constipation as needed.    Itching:  If you experience itching with your medications, try taking only a single pain pill, or even half a pain pill at a time.  You may take up to 10 pain pills per day, and you can also use benadryl over the counter for itching or also to help with sleep.   Precautions:  If you experience chest pain or shortness of breath - call 911 immediately for transfer to the hospital emergency department!!  If you develop a fever greater that 101 F, purulent drainage from wound, increased redness or drainage from wound,  or calf pain -- Call the office at [phone]                                                Follow- Up Appointment:  Please call for an appointment to be seen in 2 weeks [place] - [phone]

## 2017-05-07 NOTE — Transfer of Care (Signed)
Immediate Anesthesia Transfer of Care Note  Patient: Bobby Cole  Procedure(s) Performed: Procedure(s): LEFT TOTAL SHOULDER REVISION (Left)  Patient Location: PACU  Anesthesia Type:General and GA combined with regional for post-op pain  Level of Consciousness: sedated and responds to stimulation  Airway & Oxygen Therapy: Patient Spontanous Breathing and Patient connected to nasal cannula oxygen  Post-op Assessment: Report given to RN, Post -op Vital signs reviewed and stable and Patient moving all extremities  Post vital signs: Reviewed and stable  Last Vitals:  Vitals:   05/07/17 1025 05/07/17 1422  BP: 139/88 (!) (P) 101/57  Pulse: 80   Resp: 16 (P) 16  Temp:  (!) (P) 36.4 C    Last Pain:  Vitals:   05/07/17 1025  TempSrc:   PainSc: 0-No pain      Patients Stated Pain Goal: 3 (01/60/10 9323)  Complications: No apparent anesthesia complications

## 2017-05-07 NOTE — Anesthesia Postprocedure Evaluation (Signed)
Anesthesia Post Note  Patient: Bobby Cole  Procedure(s) Performed: Procedure(s) (LRB): LEFT TOTAL SHOULDER REVISION (Left)     Patient location during evaluation: PACU Anesthesia Type: General Level of consciousness: awake and alert Pain management: pain level controlled Vital Signs Assessment: post-procedure vital signs reviewed and stable Respiratory status: spontaneous breathing, nonlabored ventilation, respiratory function stable and patient connected to nasal cannula oxygen Cardiovascular status: blood pressure returned to baseline and stable Postop Assessment: no signs of nausea or vomiting Anesthetic complications: no    Last Vitals:  Vitals:   05/07/17 1025 05/07/17 1422  BP: 139/88 (!) 101/57  Pulse: 80 75  Resp: 16 16  Temp:  (!) 36.4 C    Last Pain:  Vitals:   05/07/17 1422  TempSrc:   PainSc: 0-No pain                 Karlissa Aron

## 2017-05-07 NOTE — Anesthesia Preprocedure Evaluation (Addendum)
Anesthesia Evaluation  Patient identified by MRN, date of birth, ID band Patient awake    Reviewed: Allergy & Precautions, NPO status , Patient's Chart, lab work & pertinent test results  Airway Mallampati: II  TM Distance: >3 FB Neck ROM: Limited    Dental no notable dental hx.    Pulmonary neg pulmonary ROS,    Pulmonary exam normal breath sounds clear to auscultation       Cardiovascular negative cardio ROS Normal cardiovascular exam Rhythm:Regular Rate:Normal     Neuro/Psych negative neurological ROS  negative psych ROS   GI/Hepatic Neg liver ROS, hiatal hernia, GERD  ,  Endo/Other  negative endocrine ROS  Renal/GU negative Renal ROS     Musculoskeletal  (+) Arthritis ,   Abdominal   Peds  Hematology negative hematology ROS (+)   Anesthesia Other Findings   Reproductive/Obstetrics negative OB ROS                           Anesthesia Physical  Anesthesia Plan  ASA: II  Anesthesia Plan: General   Post-op Pain Management: GA combined w/ Regional for post-op pain   Induction: Intravenous  PONV Risk Score and Plan: 1 and 2 and Ondansetron, Treatment may vary due to age or medical condition and Propofol  Airway Management Planned: Oral ETT  Additional Equipment:   Intra-op Plan:   Post-operative Plan: Extubation in OR  Informed Consent: I have reviewed the patients History and Physical, chart, labs and discussed the procedure including the risks, benefits and alternatives for the proposed anesthesia with the patient or authorized representative who has indicated his/her understanding and acceptance.   Dental advisory given  Plan Discussed with: CRNA  Anesthesia Plan Comments: (  )       Anesthesia Quick Evaluation

## 2017-05-07 NOTE — Op Note (Signed)
05/07/2017  1:53 PM  PATIENT:  Bobby Cole    PRE-OPERATIVE DIAGNOSIS:  Left shoulder primary localized osteoarthritis, with retained hemi-cap Arthroplasty, humeral head  POST-OPERATIVE DIAGNOSIS:  Same  PROCEDURE:  LEFT TOTAL SHOULDER REVISION arthroplasty with removal of head implant, and conversion to total shoulder replacement  SURGEON:  Johnny Bridge, MD  PHYSICIAN ASSISTANT: Joya Gaskins, OPA-C, present and scrubbed throughout the case, critical for completion in a timely fashion, and for retraction, instrumentation, and closure.  ANESTHESIA:   General with an interscalene block  ESTIMATED BLOOD LOSS: 350 mL  PREOPERATIVE INDICATIONS:  Bobby Cole is a  51 y.o. male who had a previous left shoulder humeral head implant for resurfacing done in 2006 and developed progressive increasing pain in the left shoulder with difficulty with daily function as well as athletic activities. He elected for surgical management.  The risks benefits and alternatives were discussed with the patient preoperatively including but not limited to the risks of infection, bleeding, nerve injury, cardiopulmonary complications, the need for revision surgery, dislocation, loosening, incomplete relief of pain, among others, and the patient was willing to proceed.   OPERATIVE IMPLANTS: Biomet size 14 micro press-fit humeral stem, size 46+18 Versa-dial humeral head, set in the E position with increased coverage posteriorly, with a medium cemented glenoid polyethylene 3 peg implant with a central regenerex noncemented post. I removed a hemi-cap with a screw fixation into the humeral head.  OPERATIVE FINDINGS: Advanced glenohumeral osteoarthritis involving the glenoid and the humeral head with substantial osteophyte formation inferiorly. He had some posterior glenoid wear. The subscapularis was fairly thin, and atrophic, I was able to get a repair, but the quality of the tissue was really not great. He had a  previous biceps tenodesis.   OPERATIVE PROCEDURE: The patient was brought to the operating room and placed in the supine position. General anesthesia was administered. IV antibiotics were given.  The upper extremity was prepped and draped in usual sterile fashion. The patient was in a beachchair position with all bony prominences padded.   Time out was performed and a deltopectoral approach was carried out. There was a fair amount of scar tissue and getting the interval was not easy, and the cephalic vein was not present. I ultimately expose the subscapularis, which was fairly thin and atrophic. I peeled the subscapularis off of the lesser tuberosity, in order to maintain length and as much quality tendon as possible, however this was somewhat less than satisfactory given the poor tissue quality he had remaining from his previous surgery.  The inferior osteophyte was removed, and release of the capsule off of the humeral side was completed. The head was dislocated, although it was difficult to deliver superiorly, and I was unable to place the intramedullary reamers because of the retained implant. I used an osteotome, but the implant was not able to be removed, with the exception off of the head portion was disengaged, but the screw was still in my way.  I used an osteotome around the screw, ultimately made my femoral head cut using freehand technique, and then was able to work around the screw to get access, and finally able to completely remove the screw using a vice grip.  I reamed sequentially. I placed the humeral cutting guide at 30 of retroversion and confirmed that I liked the version, and then completed the posterior aspect of the femoral head cut.   This was at the appropriate level. The rotator cuff was intact.  I then  placed deep retractors and exposed the glenoid. I excised the labrum circumferentially, taking care to protect the axillary nerve inferiorly.   I then placed a guidewire into  the center position, controlling appropriate version and inclination. I then reamed over the guidewire with the small reamer, and was satisfied with the preparation. I preserved the subchondral bone in order to maximize the strength and minimize the risk for subsequent subsidence.   I then drilled the central hole for the regenerex peg, and then placed the guide, and then drilled the 3 peripheral peg holes. I had excellent bony circumferential contact.   I then cleaned the glenoid, irrigated it copiously, and then dried it and cemented the prosthesis into place. Excellent seating was achieved. I had full exposure. All 4 holes were contained within the vault. The cement cured while I turned my attention to the humeral side.   I sequentially broached, up to the selected size, with the broach set at 30 of retroversion. I placed 3 #2 Maxbraidthrough the bone for subsequent repair.  The quality of the bone on the lesser tuberosity was actually fairly poor. I then placed the real stem. I trialed with multiple heads, and the above-named component was selected. Increased posterior coverage improved the coverage. The soft tissue tension was appropriate.   I then impacted the real humeral head into place, reduced the head, and irrigated copiously. Excellent stability and range of motion was achieved. I repaired the subscapularis with a total of 5 #2 FiberWire; one for the interval, one for the corner, and then the remaining three from the lesser tuberosity which had already been passed.  Satisfactory repair achieved, although I was not happy about the bulk and quality of the tendon, but it was all that he had.  I irrigated copiously once more. The subcutaneous tissue was closed with Vicryl including the deltopectoral fascia.   The skin was closed with Steri-Strips and sterile gauze was applied. He had a preoperative nerve block. He tolerated the procedure well and there were no complications.

## 2017-05-07 NOTE — Anesthesia Procedure Notes (Signed)
Procedure Name: Intubation Date/Time: 05/07/2017 10:55 AM Performed by: Rebekah Chesterfield L Pre-anesthesia Checklist: Patient identified, Emergency Drugs available, Suction available and Patient being monitored Patient Re-evaluated:Patient Re-evaluated prior to induction Oxygen Delivery Method: Circle System Utilized Preoxygenation: Pre-oxygenation with 100% oxygen Induction Type: IV induction Ventilation: Mask ventilation without difficulty Laryngoscope Size: Mac and 4 Grade View: Grade II Tube type: Oral Tube size: 8.0 mm Number of attempts: 1 Airway Equipment and Method: Stylet and Oral airway Placement Confirmation: ETT inserted through vocal cords under direct vision,  positive ETCO2 and breath sounds checked- equal and bilateral Secured at: 23 cm Tube secured with: Tape Dental Injury: Teeth and Oropharynx as per pre-operative assessment

## 2017-05-07 NOTE — H&P (Signed)
PREOPERATIVE H&P  Chief Complaint: djd left shoulder  HPI: Bobby Cole is a 51 y.o. male who presents for preoperative history and physical with a diagnosis of djd left shoulder. Symptoms are rated as moderate to severe, and have been worsening.  This is significantly impairing activities of daily living.  He has elected for surgical management.   He has declined injections, failed activity modification, anti-inflammatories, and assistive devices.  Preoperative X-rays demonstrate end stage degenerative changes with osteophyte formation, loss of joint space, subchondral sclerosis.  He has had a previous shoulder hemi-cap performed in [place]. This did help with his pain, this was back thousand 6, but he now feels like things are "falling apart".  Past Medical History:  Diagnosis Date  . Asthma    exercise induced asthma no inhalers used  . DJD of left shoulder   . GERD (gastroesophageal reflux disease)   . History of hiatal hernia   . Pneumonia    yrs. ago   Past Surgical History:  Procedure Laterality Date  . APPENDECTOMY    . BACK SURGERY     cervical fusion C 4 thru C7  . CHOLECYSTECTOMY    . ESOPHAGOGASTRODUODENOSCOPY N/A 04/22/2017   Procedure: ESOPHAGOGASTRODUODENOSCOPY (EGD);  Surgeon: Ronnette Juniper, MD;  Location: [facility];  Service: Gastroenterology;  Laterality: N/A;  . left acl repair    . left calf fasciotomy     compartment syndrome  . left forearm surgery for osteomyelitis    . left shoulder partial replacement Left 2006   hemicap  . left wrist carpel tunnel surgery    . left wrist fusion with traction    . lower back fusion L 4 to S 1   2016  . multipl e knee meniscus repairs Bilateral   . partial amputation right pinkie    . pectoral and long head bicep repair Right   . right hip femoral surgery  2014   Social History   Social History  . Marital status: Married    Spouse name: N/A  . Number of children: N/A  . Years of education: N/A   Social  History Main Topics  . Smoking status: Never Smoker  . Smokeless tobacco: Never Used  . Alcohol use Yes     Comment: occ  . Drug use: No  . Sexual activity: Not Asked   Other Topics Concern  . None   Social History Narrative  . None   History reviewed. No pertinent family history. Allergies  Allergen Reactions  . Fish Allergy Anaphylaxis  . Codeine Nausea Only   Prior to Admission medications   Medication Sig Start Date End Date Taking? Authorizing Provider  ibuprofen (ADVIL,MOTRIN) 200 MG tablet Take 400-800 mg by mouth every 6 (six) hours as needed for headache, mild pain or moderate pain.   Yes [provider]  omeprazole (PRILOSEC OTC) 20 MG tablet Take 20 mg by mouth 2 (two) times daily.   Yes [provider]  pravastatin (PRAVACHOL) 40 MG tablet Take 40 mg by mouth every evening. 04/04/17  Yes [provider]  testosterone cypionate (DEPOTESTOSTERONE CYPIONATE) 200 MG/ML injection Inject 200 mg into the muscle once a week. Saturdays. 04/04/17  Yes [provider]  anastrozole (ARIMIDEX) 1 MG tablet Take 1 mg by mouth daily. 04/06/17   [provider]     Positive ROS: All other systems have been reviewed and were otherwise negative with the exception of those mentioned in the HPI and as above.  Physical  Exam: General: Alert, no acute distress Cardiovascular: No pedal edema Respiratory: No cyanosis, no use of accessory musculature GI: No organomegaly, abdomen is soft and non-tender Skin: No lesions in the area of chief complaint Neurologic: Sensation intact distally Psychiatric: Patient is competent for consent with normal mood and affect Lymphatic: No axillary or cervical lymphadenopathy  MUSCULOSKELETAL: Left shoulder active motion 0-95 of external rotation to 40 and rotator cuff strength that is intact, with a significant palpable clunk during movement of the left shoulder.  Assessment: Left shoulder primary localized  osteoarthritis, status post partial hemiarthroplasty done in 2006.   Plan: Plan for Procedure(s): LEFT TOTAL SHOULDER REVISION, with removal of humeral component, and conversion to total shoulder replacement.  The risks benefits and alternatives were discussed with the patient including but not limited to the risks of nonoperative treatment, versus surgical intervention including infection, bleeding, nerve injury,  blood clots, cardiopulmonary complications, morbidity, mortality, among others, and they were willing to proceed. We have also discussed the risks for subscapularis failure, incomplete relief of pain, the need for future revision surgery, and the need to reduce his weight lifting type activities.  Johnny Bridge, MD Cell [phone]   05/07/2017 9:47 AM

## 2017-05-08 ENCOUNTER — Encounter (HOSPITAL_COMMUNITY): Payer: Self-pay | Admitting: Orthopedic Surgery

## 2017-05-08 LAB — BASIC METABOLIC PANEL
ANION GAP: 7 (ref 5–15)
BUN: 8 mg/dL (ref 6–20)
CO2: 25 mmol/L (ref 22–32)
Calcium: 9 mg/dL (ref 8.9–10.3)
Chloride: 102 mmol/L (ref 101–111)
Creatinine, Ser: 0.85 mg/dL (ref 0.61–1.24)
GLUCOSE: 140 mg/dL — AB (ref 65–99)
POTASSIUM: 5.6 mmol/L — AB (ref 3.5–5.1)
Sodium: 134 mmol/L — ABNORMAL LOW (ref 135–145)

## 2017-05-08 LAB — CBC
HCT: 37.4 % — ABNORMAL LOW (ref 39.0–52.0)
Hemoglobin: 12.6 g/dL — ABNORMAL LOW (ref 13.0–17.0)
MCH: 31.2 pg (ref 26.0–34.0)
MCHC: 33.7 g/dL (ref 30.0–36.0)
MCV: 92.6 fL (ref 78.0–100.0)
PLATELETS: 205 10*3/uL (ref 150–400)
RBC: 4.04 MIL/uL — AB (ref 4.22–5.81)
RDW: 15.2 % (ref 11.5–15.5)
WBC: 14.5 10*3/uL — AB (ref 4.0–10.5)

## 2017-05-08 MED ORDER — OXYCODONE HCL 5 MG PO TABS
5.0000 mg | ORAL_TABLET | ORAL | Status: DC | PRN
  Administered 2017-05-08 (×2): 10 mg via ORAL
  Filled 2017-05-08 (×2): qty 2

## 2017-05-08 NOTE — Progress Notes (Signed)
PT Cancellation Note  Patient Details Name: Bobby Cole MRN: [id number] DOB: Oct 12, 1965   Cancelled Treatment:    Reason Eval/Treat Not Completed: PT screened, no needs identified, will sign off. PT observed pt working with OT, pt mod I with mobility and demos no need for skilled acute PT at this time. Please re-consult if needed in future.    Loza Prell M Chaia Ikard 05/08/2017, 9:52 AM   Kittie Plater, PT, DPT Pager #: [phone] Office #: [phone]

## 2017-05-08 NOTE — Evaluation (Signed)
Occupational Therapy Evaluation Patient Details Name: Bobby Cole MRN: [id number] DOB: July 22, 1966 Today's Date: 05/08/2017    History of Present Illness 51 y.o. male who presents for preoperative history and physical with a diagnosis of djd left shoulder. S/p L total shoulder revision on 05/07/17. 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     Clinical Impression   PTA, pt was living with his wife and was independent. Pt currently requiring minimal A for UB ADLs. Provided pt with education and handout on shoulder precautions, exercises, UB ADLs, edema management, and sling management. Pt demonstrating good understanding of education provided. Recommend dc home once medically stable per physician. Answered all pt questions in preparation for dc later today. All acute OT need met. Will sign off. Thank you.    Follow Up Recommendations  DC plan and follow up therapy as arranged by surgeon;Supervision - Intermittent    Equipment Recommendations  None recommended by OT    Recommendations for Other Services       Precautions / Restrictions Precautions Precautions: Shoulder Type of Shoulder Precautions: Conservative protocal. No A/PROM of shoulder Shoulder Interventions: Shoulder sling/immobilizer;Off for dressing/bathing/exercises;At all times Precaution Booklet Issued: Yes (comment) Precaution Comments: Provided handout and reviewed all information; pt demonstrating understanding Required Braces or Orthoses: Sling Restrictions Weight Bearing Restrictions: Yes LUE Weight Bearing: Non weight bearing      Mobility Bed Mobility               General bed mobility comments: Pt in recliner upon arrival. Educated pt on sleeping position for bed and recliner  Transfers Overall transfer level: Independent                    Balance Overall balance assessment: No apparent balance deficits (not formally  assessed)                                         ADL either performed or assessed with clinical judgement   ADL Overall ADL's : Needs assistance/impaired Eating/Feeding: Independent   Grooming: Set up;Standing   Upper Body Bathing: Sitting;Adhering to UE precautions;Minimal assistance Upper Body Bathing Details (indicate cue type and reason): Educated pt on UB bathing with shoulder precautions Lower Body Bathing: Sit to/from stand;Modified independent   Upper Body Dressing : Minimal assistance;Sitting;Adhering to UE precautions;Cueing for compensatory techniques Upper Body Dressing Details (indicate cue type and reason): Pt donned shirts with minimal A to adjust. Cues for compensatory technique. Pt able to don sling with Minimal assistance to adjust position Lower Body Dressing: Modified independent;Sit to/from stand Lower Body Dressing Details (indicate cue type and reason): Pt already donned pants upon arrival. Educated pt on one handed techniques for socks. Pt donned socks without physical A Toilet Transfer: Independent;Ambulation;Regular Toilet   Toileting- Water quality scientist and Hygiene: Independent;Sit to/from stand       Functional mobility during ADLs: Independent General ADL Comments: Pt demonstrating good functional performance and adherance to shoulder precautions during UB ADLs. Educated pt on precautions, excericses, UB ADLs, sling management, and oen handed LB ADL techniues     Vision Baseline Vision/History: Wears glasses Patient Visual Report: No change from baseline       Perception     Praxis      Pertinent Vitals/Pain Pain Assessment: 0-10 Pain Score: 6  Pain Location: L shoulder Pain  Descriptors / Indicators: Constant;Discomfort Pain Intervention(s): Monitored during session;Repositioned;RN gave pain meds during session     Hand Dominance Left   Extremity/Trunk Assessment Upper Extremity Assessment Upper Extremity Assessment:  LUE deficits/detail LUE Deficits / Details: s/p L total shoulder revision LUE: Unable to fully assess due to immobilization LUE Sensation:  (intact) LUE Coordination: decreased gross motor   Lower Extremity Assessment Lower Extremity Assessment: Overall WFL for tasks assessed   Cervical / Trunk Assessment Cervical / Trunk Assessment: Normal (PMH including cervical fusion 2 years ago)   Communication Communication Communication: No difficulties   Cognition Arousal/Alertness: Awake/alert Behavior During Therapy: WFL for tasks assessed/performed Overall Cognitive Status: Within Functional Limits for tasks assessed                                     General Comments  Provided shoulder hand out    Exercises Exercises: Shoulder Shoulder Exercises Elbow Flexion: AROM;Left;15 reps;Seated Elbow Extension: AROM;Left;15 reps;Seated Wrist Flexion: AROM;Left;15 reps;Seated Wrist Extension: AROM;Left;15 reps;Seated Digit Composite Flexion: AROM;Left;15 reps;Seated Composite Extension: AROM;Left;15 reps;Seated Neck Flexion: Other (comment) (Educated pt on importance of neck stretching and excercises) Neck Extension:  (Educated pt on importance of neck stretching and excercises) Neck Lateral Flexion - Right:  (Educated pt on importance of neck stretching and excercises) Neck Lateral Flexion - Left:  (Educated pt on importance of neck stretching and excercises)   Shoulder Instructions Shoulder Instructions Donning/doffing shirt without moving shoulder: Minimal assistance Method for sponge bathing under operated UE: Minimal assistance Donning/doffing sling/immobilizer: Minimal assistance Correct positioning of sling/immobilizer: Minimal assistance Pendulum exercises (written home exercise program):  (NA) ROM for elbow, wrist and digits of operated UE: Supervision/safety Sling wearing schedule (on at all times/off for ADL's): Independent Proper positioning of operated UE  when showering: Supervision/safety Dressing change:  (NA) Positioning of UE while sleeping: Raoul expects to be discharged to:: Private residence Living Arrangements: Spouse/significant other Available Help at Discharge: Family;Available 24 hours/day Type of Home: House       Home Layout: Two level;Able to live on main level with bedroom/bathroom Alternate Level Stairs-Number of Steps: 13 Alternate Level Stairs-Rails: Right Bathroom Shower/Tub: Occupational psychologist: Standard     Home Equipment: Hand held shower head          Prior Functioning/Environment Level of Independence: Independent        Comments: ADLs, IADLs, driving, working as an Psychologist, forensic Problem List: Decreased range of motion;Decreased knowledge of precautions;Pain;Impaired UE functional use      OT Treatment/Interventions:      OT Goals(Current goals can be found in the care plan section) Acute Rehab OT Goals Patient Stated Goal: Go home OT Goal Formulation: With patient Time For Goal Achievement: 05/22/17 Potential to Achieve Goals: Good  OT Frequency:     Barriers to D/C:            Co-evaluation              AM-PAC PT "6 Clicks" Daily Activity     Outcome Measure Help from another person eating meals?: None Help from another person taking care of personal grooming?: None Help from another person toileting, which includes using toliet, bedpan, or urinal?: None Help from another person bathing (including washing, rinsing, drying)?: A Little Help from another person to put on and taking off regular  upper body clothing?: A Little Help from another person to put on and taking off regular lower body clothing?: None 6 Click Score: 22   End of Session Equipment Utilized During Treatment: Other (comment) (Sling) Nurse Communication: Mobility status;Other (comment);Patient requests pain meds (Ready for  dc)  Activity Tolerance: Patient tolerated treatment well Patient left: in chair;with call bell/phone within reach  OT Visit Diagnosis: Pain Pain - Right/Left: Left Pain - part of body: Shoulder                Time: 5913-6859 OT Time Calculation (min): 23 min Charges:  OT General Charges $OT Visit: 1 Procedure OT Evaluation $OT Eval Low Complexity: 1 Procedure OT Treatments $Self Care/Home Management : 8-22 mins G-Codes:     Skeeter Sheard MSOT, OTR/L Acute Rehab Pager: [phone] Office: [place] 05/08/2017, 9:59 AM

## 2017-05-08 NOTE — Discharge Summary (Signed)
Physician Discharge Summary  Patient ID: Bobby Cole MRN: [id number] DOB/AGE: 50-17-1967 50 y.o.  Admit date: 05/07/2017 Discharge date: 05/08/2017  Admission Diagnoses:  Primary localized osteoarthrosis of left shoulder  Discharge Diagnoses:  Principal Problem:   Primary localized osteoarthrosis of left shoulder   Past Medical History:  Diagnosis Date  . Asthma    exercise induced asthma no inhalers used  . DJD of left shoulder   . GERD (gastroesophageal reflux disease)   . History of hiatal hernia   . Pneumonia    yrs. ago  . Primary localized osteoarthrosis of left shoulder 05/07/2017    Surgeries: Procedure(s): LEFT TOTAL SHOULDER REVISION on 05/07/2017   Consultants (if any):   Discharged Condition: Improved  Hospital Course: Bobby Cole is an 51 y.o. male who was admitted 05/07/2017 with a diagnosis of Primary localized osteoarthrosis of left shoulder and went to the operating room on 05/07/2017 and underwent the above named procedures.    He was given perioperative antibiotics:  Anti-infectives    Start     Dose/Rate Route Frequency Ordered Stop   05/07/17 1745  ceFAZolin (ANCEF) IVPB 1 g/50 mL premix     1 g 100 mL/hr over 30 Minutes Intravenous Every 6 hours 05/07/17 1731 05/08/17 0538   05/07/17 0926  ceFAZolin (ANCEF) 2-4 GM/100ML-% IVPB    Comments:  Block, Sarah   : cabinet override      05/07/17 0926 05/07/17 1040   05/07/17 0916  ceFAZolin (ANCEF) IVPB 2g/100 mL premix     2 g 200 mL/hr over 30 Minutes Intravenous On call to O.R. 05/07/17 9485 05/07/17 1110    .  He was given sequential compression devices, early ambulation for DVT prophylaxis.  He benefited maximally from the hospital stay and there were no complications.    Recent vital signs:  Vitals:   05/08/17 0210 05/08/17 0454  BP: 123/68 117/66  Pulse: 80 84  Resp: 18 18  Temp: 97.7 F (36.5 C) 98 F (36.7 C)    Recent laboratory studies:  Lab Results  Component Value Date   HGB 12.6  (L) 05/08/2017   HGB 15.9 04/26/2017   Lab Results  Component Value Date   WBC 14.5 (H) 05/08/2017   PLT 205 05/08/2017   No results found for: INR Lab Results  Component Value Date   NA 134 (L) 05/08/2017   K 5.6 (H) 05/08/2017   CL 102 05/08/2017   CO2 25 05/08/2017   BUN 8 05/08/2017   CREATININE 0.85 05/08/2017   GLUCOSE 140 (H) 05/08/2017    Discharge Medications:   Allergies as of 05/08/2017      Reactions   Fish Allergy Anaphylaxis   Codeine Nausea Only      Medication List    STOP taking these medications   ibuprofen 200 MG tablet Commonly known as:  ADVIL,MOTRIN     TAKE these medications   anastrozole 1 MG tablet Commonly known as:  ARIMIDEX Take 1 mg by mouth daily.   baclofen 10 MG tablet Commonly known as:  LIORESAL Take 1 tablet (10 mg total) by mouth 3 (three) times daily. As needed for muscle spasm   omeprazole 20 MG tablet Commonly known as:  PRILOSEC OTC Take 20 mg by mouth 2 (two) times daily.   ondansetron 4 MG tablet Commonly known as:  ZOFRAN Take 1 tablet (4 mg total) by mouth every 8 (eight) hours as needed for nausea or vomiting.   oxyCODONE 5 MG immediate  release tablet Commonly known as:  ROXICODONE Take 1-2 tablets (5-10 mg total) by mouth every 4 (four) hours as needed for severe pain.   pravastatin 40 MG tablet Commonly known as:  PRAVACHOL Take 40 mg by mouth every evening.   sennosides-docusate sodium 8.6-50 MG tablet Commonly known as:  SENOKOT-S Take 2 tablets by mouth daily.   testosterone cypionate 200 MG/ML injection Commonly known as:  DEPOTESTOSTERONE CYPIONATE Inject 200 mg into the muscle once a week. Saturdays.       Diagnostic Studies: Dg Esophagus  Result Date: 04/17/2017 CLINICAL DATA:  Dysphagia. EXAM: ESOPHOGRAM/BARIUM SWALLOW TECHNIQUE: Combined double contrast and single contrast examination performed using effervescent crystals, thick barium liquid, and thin barium liquid. FLUOROSCOPY TIME:   Fluoroscopy Time:  1 minutes and 18 seconds Radiation Exposure Index (if provided by the fluoroscopic device): 173 mGy Number of Acquired Spot Images: 0 COMPARISON:  None. FINDINGS: Initial barium swallows demonstrate normal pharyngeal motion with swallowing. No laryngeal penetration or aspiration. No upper esophageal webs, strictures or diverticuli. Surgical changes involving the cervical spine with anterior and interbody fusion hardware at C5-6 and C6-7. Spurring changes at C4-5 without significant mass effect on the posterior esophagus. Normal esophageal motility. No intrinsic or extrinsic lesions are identified. No significant mucosal abnormality. Mild smooth narrowing of the distal esophagus suggesting a reflux stricture. Very small sliding-type hiatal hernia noted with inducible GE reflux. IMPRESSION: 1. Normal esophageal motility. 2. Very small sliding-type hiatal hernia with minimal inducible GE reflux. 3. Smooth strictured narrowing of the distal esophagus. Electronically Signed   By: Marijo Sanes M.D.   On: 04/17/2017 12:56   Dg Shoulder Left Port  Result Date: 05/07/2017 CLINICAL DATA:  Status post total shoulder replacement EXAM: LEFT SHOULDER - 1 VIEW COMPARISON:  CT left shoulder March 26, 2017 FINDINGS: Frontal view obtained. There is a total shoulder replacement on the left with prosthetic components appearing well-seated. No fracture or dislocation. There is mild narrowing of the acromioclavicular joint. No erosive change. There is atelectasis in the left lung base. IMPRESSION: Total shoulder replacement with prosthetic component well-seated on frontal view. There is a degree of osteoarthritic change in the acromioclavicular joint. No fracture or dislocation. There is atelectasis in the left lung base. Electronically Signed   By: Lowella Grip III M.D.   On: 05/07/2017 15:53    Disposition: 01-Home or Self Care    Follow-up Information    Marchia Bond, MD. Schedule an appointment as  soon as possible for a visit in 2 weeks.   Specialty:  Orthopedic Surgery Contact information: [place] [id number] [phone]            Signed: Johnny Bridge 05/08/2017, 8:24 AM

## 2018-04-14 ENCOUNTER — Other Ambulatory Visit: Payer: Self-pay | Admitting: Gastroenterology

## 2018-05-06 ENCOUNTER — Other Ambulatory Visit: Payer: Self-pay | Admitting: Physician Assistant

## 2018-05-06 DIAGNOSIS — R609 Edema, unspecified: Secondary | ICD-10-CM

## 2018-05-07 ENCOUNTER — Ambulatory Visit (INDEPENDENT_AMBULATORY_CARE_PROVIDER_SITE_OTHER): Payer: Federal, State, Local not specified - PPO

## 2018-05-07 DIAGNOSIS — R221 Localized swelling, mass and lump, neck: Secondary | ICD-10-CM | POA: Diagnosis not present

## 2018-05-07 DIAGNOSIS — R609 Edema, unspecified: Secondary | ICD-10-CM

## 2018-06-09 NOTE — H&P (Signed)
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 He was advised to take protonix 40 mg BID and fluticasone 220 mcg BID(04/23/17). EGD was last performed on 04/23/17(eosinophilic gastritis and esophagitis), he also needs a screening colonoscopy. Barium swallow from 04/17/17 had shown a smooth stricture at distal esophagus. Patient denies symptoms of difficulty swallowing or pain on swallowing. He does complain of heartburn as well as acid reflux which is worse later in the evening and he has been sleeping with his pillows propped up at night. He denies change in bowel habits. He is on as azithromycin, prednisone and benzonatate for an upper respiratory tract infection.   Current Medications  Taking   Pravastatin Sodium 40 MG Tablet 1 tablet Orally Once a day   Fluticasone Propionate HFA 220 MCG/ACT Aerosol 1 puff by mouth, to be swallowed Twice a day   Ibuprofen 200 MG Tablet 1 tablet with food or milk as needed Orally as needed   Azithromycin 250 MG Tablet 2 tablets on the first day, then 1 tablet daily for 4 days Orally Once a day   PredniSONE 10 MG (21) Tablet Therapy Pack as directed Orally   Benzonatate 200 MG Capsule 1 capsule Orally Three times a day   Testosterone Cypionate 200 MG/ML Solution 1 ml Intramuscular once weekly   Prilosec OTC(Omeprazole Magnesium) 20 MG Tablet Delayed Release 1 tablet Orally twice a day   Not-Taking   Protonix(Pantoprazole Sodium) 40 MG Tablet Delayed Release 1 tablet Orally twice a day   Unknown   Syringe Avitene - Miscellaneous as directed Externally once weekly   Medication List reviewed and reconciled with the patient    Past Medical History  Asthma.   Hay Fever.   Osteomyelitis.   Hypertention.   High cholesterol.   Joint pain.   Reflux.   Difficulty swallowing.    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   ENDO. 03/2017  Lt total shoulder replacement 05/2017   Family History  Father: alive, High Cholesterol, polyp  Mother: alive  Sister 1: alive, High cholesterol  Sister 2: deceased  2 sister(s) .   Negative Family History of Cancer\n, No Family History of Colon Cancer, or Liver Disease.   Social History  General:  Tobacco use  cigarettes: Never smoked Tobacco history last updated 11/08/2017 Alcohol: occasionally.  Caffeine: maybe once a week.  no Recreational drug use.  Marital Status: married.  Children: none.  OCCUPATION: [employer].    Allergies  Codeine Sulfate: stomach upset - Allergy   Hospitalization/Major Diagnostic Procedure  childbirth   not in the past yr 11/2017   Review of Systems  GI PROCEDURE:  no Pacemaker/ AICD, no. no Artificial heart valves. no MI/heart attack. no Abnormal heart rhythm. no Angina. no CVA. Hypertension YES, YES. no Hypotension. Asthma, COPD YES, YES asthma. no Sleep apnea. no Seizure disorders. Artificial joints YES, YES left shoulder. no Severe DJD. no Diabetes. no Significant headaches. no Vertigo. no Depression/anxiety. no Abnormal bleeding. no Kidney  Disease. no Liver disease, no. no Blood transfusion.      Vital Signs  Wt 248.8, Wt change -5 lb, Ht 70, BMI 35.70, Temp 96.9, Pulse sitting 76, BP sitting 159/83.   Examination  Gastroenterology:: GENERAL APPEARANCE: Well developed, overweight, no active distress, pleasant, no acute distress.  EYES: Lids and conjunctiva normal. Sclera normal, pupils equal and reactive .  ORAL CAVITY: Lips, teeth and gums are normal. Pharynx, tongue, mucosa normal .  SCLERA:  anicteric .  NECK Full ROM, trachea midline, no thyromegaly or masses .  CARDIOVASCULAR PMI LS border. Normal RRR w/o murmers or gallops. No peripheral edema .  RESPIRATORY Breath sounds normal. Respiration even and unlabored .  ABDOMEN No masses palpated. Liver and spleen not palpated, normal. Bowel sounds normal, Abdomen not distended .  EXTREMITIES: No edema, pulses intact .  NEURO: normal strength and reflexes, cranial nerves II-XII grossly intact, normal gait .  PSYCH: mood/affect normal .     Assessments   1. 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 Screen for colon cancer - Z12.11   Treatment  1. Eosinophilic esophagitis  Start Fluticasone Propionate HFA Aerosol, 220 MCG/ACT, 1 puff, 1 puff to be swallowed, Twice a day, 90 days, 3 Inhaler, Refills 1 Start Prilosec Capsule Delayed Release, 40 MG, 1 capsule, Orally, twice a day, 90 days, 180 Capsule, Refills 3 IMAGING: Esophagoscopy    Whitfield,Dia 11/11/2017 09:24:32 AM > pt is to be done at the hospital ([facility]) in 03/2018-will mail instructions once scheduled. Action made.   Notes: Patient will need a repeat endoscopy in July 2019 to obtain biopsies from esophagus for evaluation of eosinophilic burden( was 27 HPF),assessed disease activity and dilatation if needed. Meanwhile advise patient to increase stools of Prilosec to 40 milligrams and take it twice a day and continue fluticasone inhaler/swallow twice a day.    2. Screen for colon cancer  IMAGING: Colonoscopy    Whitfield,Dia 11/11/2017 09:24:32 AM > pt is to be done at the hospital ([facility]) in 03/2018-will mail instructions once scheduled. Action made.   Notes: The risk and benefits of a screening colonoscopy were explained to the patient in details. He understands and verbalizes consent. Will be given written instructions, prescription for preparation and will be scheduled for the same.    Ronnette Juniper, MD

## 2018-06-10 ENCOUNTER — Other Ambulatory Visit: Payer: Self-pay

## 2018-06-10 ENCOUNTER — Encounter (HOSPITAL_COMMUNITY): Payer: Self-pay | Admitting: *Deleted

## 2018-06-10 ENCOUNTER — Ambulatory Visit (HOSPITAL_COMMUNITY): Payer: Federal, State, Local not specified - PPO | Admitting: Certified Registered Nurse Anesthetist

## 2018-06-10 ENCOUNTER — Ambulatory Visit (HOSPITAL_COMMUNITY)
Admission: RE | Admit: 2018-06-10 | Discharge: 2018-06-10 | Disposition: A | Discharge status: Home / Self Care | Payer: Federal, State, Local not specified - PPO | Source: Ambulatory Visit | Attending: Gastroenterology | Admitting: Gastroenterology

## 2018-06-10 ENCOUNTER — Encounter (HOSPITAL_COMMUNITY)
Admission: RE | Disposition: A | Discharge status: Home / Self Care | Payer: Self-pay | Source: Ambulatory Visit | Attending: Gastroenterology

## 2018-06-10 DIAGNOSIS — K6289 Other specified diseases of anus and rectum: Secondary | ICD-10-CM | POA: Insufficient documentation

## 2018-06-10 DIAGNOSIS — I10 Essential (primary) hypertension: Secondary | ICD-10-CM | POA: Diagnosis not present

## 2018-06-10 DIAGNOSIS — E7800 Pure hypercholesterolemia, unspecified: Secondary | ICD-10-CM | POA: Diagnosis not present

## 2018-06-10 DIAGNOSIS — Z96612 Presence of left artificial shoulder joint: Secondary | ICD-10-CM | POA: Insufficient documentation

## 2018-06-10 DIAGNOSIS — J45909 Unspecified asthma, uncomplicated: Secondary | ICD-10-CM | POA: Diagnosis not present

## 2018-06-10 DIAGNOSIS — K2950 Unspecified chronic gastritis without bleeding: Secondary | ICD-10-CM | POA: Diagnosis not present

## 2018-06-10 DIAGNOSIS — K222 Esophageal obstruction: Secondary | ICD-10-CM | POA: Insufficient documentation

## 2018-06-10 DIAGNOSIS — Z1211 Encounter for screening for malignant neoplasm of colon: Secondary | ICD-10-CM | POA: Diagnosis present

## 2018-06-10 DIAGNOSIS — K219 Gastro-esophageal reflux disease without esophagitis: Secondary | ICD-10-CM | POA: Insufficient documentation

## 2018-06-10 DIAGNOSIS — K200 Eosinophilic esophagitis: Secondary | ICD-10-CM | POA: Diagnosis not present

## 2018-06-10 DIAGNOSIS — Z79899 Other long term (current) drug therapy: Secondary | ICD-10-CM | POA: Insufficient documentation

## 2018-06-10 DIAGNOSIS — Z7951 Long term (current) use of inhaled steroids: Secondary | ICD-10-CM | POA: Diagnosis not present

## 2018-06-10 DIAGNOSIS — Z7952 Long term (current) use of systemic steroids: Secondary | ICD-10-CM | POA: Insufficient documentation

## 2018-06-10 DIAGNOSIS — D122 Benign neoplasm of ascending colon: Secondary | ICD-10-CM | POA: Diagnosis not present

## 2018-06-10 HISTORY — PX: ESOPHAGOGASTRODUODENOSCOPY: SHX5428

## 2018-06-10 HISTORY — PX: COLONOSCOPY: SHX5424

## 2018-06-10 HISTORY — PX: BIOPSY: SHX5522

## 2018-06-10 SURGERY — EGD (ESOPHAGOGASTRODUODENOSCOPY)
Anesthesia: Monitor Anesthesia Care

## 2018-06-10 MED ORDER — PROPOFOL 500 MG/50ML IV EMUL
INTRAVENOUS | Status: DC | PRN
  Administered 2018-06-10: 200 ug/kg/min via INTRAVENOUS

## 2018-06-10 MED ORDER — PROPOFOL 10 MG/ML IV BOLUS
INTRAVENOUS | Status: AC
  Filled 2018-06-10: qty 20

## 2018-06-10 MED ORDER — PROPOFOL 10 MG/ML IV BOLUS
INTRAVENOUS | Status: DC | PRN
  Administered 2018-06-10: 50 mg via INTRAVENOUS

## 2018-06-10 MED ORDER — RANITIDINE HCL 150 MG PO TABS: 150.0000 mg | ORAL_TABLET | Freq: Every day | ORAL | 3 refills | Status: AC

## 2018-06-10 MED ORDER — SODIUM CHLORIDE 0.9 % IV SOLN: INTRAVENOUS | Status: DC

## 2018-06-10 MED ORDER — LACTATED RINGERS IV SOLN
INTRAVENOUS | Status: DC
  Administered 2018-06-10 (×2): via INTRAVENOUS

## 2018-06-10 MED ORDER — LIDOCAINE 2% (20 MG/ML) 5 ML SYRINGE
INTRAMUSCULAR | Status: DC | PRN
  Administered 2018-06-10: 60 mg via INTRAVENOUS

## 2018-06-10 NOTE — Anesthesia Postprocedure Evaluation (Signed)
Anesthesia Post Note  Patient: Bobby Cole  Procedure(s) Performed: ESOPHAGOGASTRODUODENOSCOPY (EGD) (N/A ) COLONOSCOPY (N/A ) BIOPSY ESOPHAGEAL DILITATION (N/A )     Patient location during evaluation: PACU Anesthesia Type: MAC Level of consciousness: awake and alert Pain management: pain level controlled Vital Signs Assessment: post-procedure vital signs reviewed and stable Respiratory status: spontaneous breathing, nonlabored ventilation, respiratory function stable and patient connected to nasal cannula oxygen Cardiovascular status: stable and blood pressure returned to baseline Postop Assessment: no apparent nausea or vomiting Anesthetic complications: no    Last Vitals:  Vitals:   06/10/18 0945 06/10/18 0950  BP: (!) 150/76 140/83  Pulse: 76 64  Resp: 13 16  Temp:    SpO2: 100% 100%    Last Pain:  Vitals:   06/10/18 0945  TempSrc:   PainSc: 0-No pain                 Tiajuana Amass

## 2018-06-10 NOTE — Transfer of Care (Signed)
Immediate Anesthesia Transfer of Care Note  Patient: BURECH MCFARLAND  Procedure(s) Performed: ESOPHAGOGASTRODUODENOSCOPY (EGD) (N/A ) COLONOSCOPY (N/A ) BIOPSY ESOPHAGEAL DILITATION (N/A )  Patient Location: Endoscopy Unit  Anesthesia Type:MAC  Level of Consciousness: drowsy and patient cooperative  Airway & Oxygen Therapy: Patient Spontanous Breathing and Patient connected to nasal cannula oxygen  Post-op Assessment: Report given to RN, Post -op Vital signs reviewed and stable and Patient moving all extremities  Post vital signs: Reviewed and stable  Last Vitals:  Vitals Value Taken Time  BP    Temp    Pulse    Resp 13 06/10/2018  9:44 AM  SpO2    Vitals shown include unvalidated device data.  Last Pain:  Vitals:   06/10/18 0834  TempSrc: Oral  PainSc: 0-No pain         Complications: No apparent anesthesia complications

## 2018-06-10 NOTE — Op Note (Signed)
[facility] Patient Name: Bobby Cole Procedure Date: 06/10/2018 MRN: [id number] Attending MD: Ronnette Juniper , MD Date of Birth: 1966-03-25 CSN: [id number] Age: 52 Admit Type: Outpatient Procedure:                Colonoscopy Indications:              Screening for colorectal malignant neoplasm, Last                            colonoscopy 10 years ago Providers:                Ronnette Juniper, MD, Cleda Daub, RN, William Dalton,                            Technician Referring MD:              Medicines:                Monitored Anesthesia Care Complications:            No immediate complications. Estimated blood loss:                            None. Estimated Blood Loss:     Estimated blood loss was minimal. Procedure:                Pre-Anesthesia Assessment:                           - Prior to the procedure, a History and Physical                            was performed, and patient medications and                            allergies were reviewed. The patient's tolerance of                            previous anesthesia was also reviewed. The risks                            and benefits of the procedure and the sedation                            options and risks were discussed with the patient.                            All questions were answered, and informed consent                            was obtained. Prior Anticoagulants: The patient has                            taken no previous anticoagulant or antiplatelet                            agents. ASA Grade Assessment: II -  A patient with                            mild systemic disease. After reviewing the risks                            and benefits, the patient was deemed in                            satisfactory condition to undergo the procedure.                           - Prior to the procedure, a History and Physical                            was performed, and patient medications and                    allergies were reviewed. The patient's tolerance of                            previous anesthesia was also reviewed. The risks                            and benefits of the procedure and the sedation                            options and risks were discussed with the patient.                            All questions were answered, and informed consent                            was obtained. Prior Anticoagulants: The patient has                            taken no previous anticoagulant or antiplatelet                            agents. ASA Grade Assessment: II - A patient with                            mild systemic disease. After reviewing the risks                            and benefits, the patient was deemed in                            satisfactory condition to undergo the procedure.                           After obtaining informed consent, the colonoscope                            was  passed under direct vision. Throughout the                            procedure, the patient's blood pressure, pulse, and                            oxygen saturations were monitored continuously. The                            [id number]-[id number] ([id number] Olympus peds colonoscope was                            introduced through the anus and advanced to the the                            cecum, identified by appendiceal orifice and                            ileocecal valve. The colonoscopy was performed                            without difficulty. The patient tolerated the                            procedure well. The quality of the bowel                            preparation was good. Scope In: 9:26:16 AM Scope Out: 9:39:03 AM Scope Withdrawal Time: 0 hours 8 minutes 55 seconds  Total Procedure Duration: 0 hours 12 minutes 47 seconds  Findings:      The perianal and digital rectal examinations were normal.      A 6 mm polyp was found in the ascending colon. The polyp was  sessile.       The polyp was removed with a piecemeal technique using a cold biopsy       forceps. Resection and retrieval were complete.      The exam was otherwise without abnormality.      Anal papilla(e) were hypertrophied.      No additional abnormalities were found on retroflexion. Impression:               - One 6 mm polyp in the ascending colon, removed                            piecemeal using a cold biopsy forceps. Resected and                            retrieved.                           - The examination was otherwise normal.                           - Anal papilla(e) were hypertrophied. Moderate Sedation:      Patient did not receive moderate sedation for this procedure, but  instead received monitored anesthesia care. Recommendation:           - Patient has a contact number available for                            emergencies. The signs and symptoms of potential                            delayed complications were discussed with the                            patient. Return to normal activities tomorrow.                            Written discharge instructions were provided to the                            patient.                           - Resume regular diet.                           - Continue present medications.                           - Await pathology results.                           - Repeat colonoscopy for surveillance based on                            pathology results. Procedure Code(s):        --- Professional ---                           [phone], Colonoscopy, flexible; with biopsy, single                            or multiple Diagnosis Code(s):        --- Professional ---                           Z12.11, Encounter for screening for malignant                            neoplasm of colon                           D12.2, Benign neoplasm of ascending colon                           K62.89, Other specified diseases of anus and rectum CPT  copyright 2017 American Medical Association. All rights reserved. The codes documented in this report are preliminary and upon coder review may  be revised to meet current compliance requirements. Ronnette Juniper, MD 06/10/2018 9:45:41 AM This report has been signed electronically. Number of Addenda: 0

## 2018-06-10 NOTE — Discharge Instructions (Signed)

## 2018-06-10 NOTE — Anesthesia Preprocedure Evaluation (Signed)
Anesthesia Evaluation  Patient identified by MRN, date of birth, ID band Patient awake    Reviewed: Allergy & Precautions, NPO status , Patient's Chart, lab work & pertinent test results  Airway Mallampati: II  TM Distance: >3 FB Neck ROM: Full    Dental  (+) Dental Advisory Given   Pulmonary asthma ,    breath sounds clear to auscultation       Cardiovascular negative cardio ROS   Rhythm:Regular Rate:Normal     Neuro/Psych negative neurological ROS     GI/Hepatic Neg liver ROS, hiatal hernia, GERD  Medicated,  Endo/Other  negative endocrine ROS  Renal/GU negative Renal ROS     Musculoskeletal  (+) Arthritis ,   Abdominal   Peds  Hematology negative hematology ROS (+)   Anesthesia Other Findings   Reproductive/Obstetrics                             Anesthesia Physical Anesthesia Plan  ASA: II  Anesthesia Plan: MAC   Post-op Pain Management:    Induction: Intravenous  PONV Risk Score and Plan: 1 and Ondansetron, Propofol infusion and Treatment may vary due to age or medical condition  Airway Management Planned: Natural Airway and Simple Face Mask  Additional Equipment:   Intra-op Plan:   Post-operative Plan:   Informed Consent: I have reviewed the patients History and Physical, chart, labs and discussed the procedure including the risks, benefits and alternatives for the proposed anesthesia with the patient or authorized representative who has indicated his/her understanding and acceptance.     Plan Discussed with: CRNA  Anesthesia Plan Comments:         Anesthesia Quick Evaluation

## 2018-06-10 NOTE — Op Note (Signed)
[facility] Patient Name: Bobby Cole Procedure Date: 06/10/2018 MRN: [id number] Attending MD: Ronnette Juniper , MD Date of Birth: 08-08-1966 CSN: [id number] Age: 52 Admit Type: Outpatient Procedure:                Upper GI endoscopy Indications:              Dysphagia, Follow-up of eosinophilic esophagitis Providers:                Ronnette Juniper, MD, Cleda Daub, RN, William Dalton,                            Technician Referring MD:              Medicines:                Monitored Anesthesia Care Complications:            No immediate complications. Estimated blood loss:                            None. Estimated Blood Loss:     Estimated blood loss was minimal. Procedure:                Pre-Anesthesia Assessment:                           - Prior to the procedure, a History and Physical                            was performed, and patient medications and                            allergies were reviewed. The patient's tolerance of                            previous anesthesia was also reviewed. The risks                            and benefits of the procedure and the sedation                            options and risks were discussed with the patient.                            All questions were answered, and informed consent                            was obtained. Prior Anticoagulants: The patient has                            taken no previous anticoagulant or antiplatelet                            agents. ASA Grade Assessment: II - A patient with  mild systemic disease. After reviewing the risks                            and benefits, the patient was deemed in                            satisfactory condition to undergo the procedure.                           After obtaining informed consent, the endoscope was                            passed under direct vision. Throughout the                            procedure, the patient's  blood pressure, pulse, and                            oxygen saturations were monitored continuously. The                            GIF-H190 ([id number]) Olympus adult endoscope was                            introduced through the mouth, and advanced to the                            second part of duodenum. The upper GI endoscopy was                            accomplished without difficulty. The patient                            tolerated the procedure well. Findings:      Mucosal changes including ringed esophagus and longitudinal furrows were       found in the upper third of the esophagus, in the middle third of the       esophagus and in the lower third of the esophagus. Esophageal findings       were graded using the Eosinophilic Esophagitis Endoscopic Reference       Score (EoE-EREFS) as: Edema Grade 1 Present (decreased clarity or       absence of vascular markings), Rings Grade 1 Mild (subtle       circumferential ridges seen on esophageal distension), Exudates Grade 0       None (no white lesions seen), Furrows Grade 1 Present (vertical lines       with or without visible depth) and Stricture present in the distal       esophagus. Biopsies were obtained from the proximal and distal esophagus       with cold forceps for histology of suspected eosinophilic esophagitis. A       TTS dilator was passed through the scope. Dilation with an 18-19-20 mm       balloon dilator was performed to 20 mm. The dilation site was examined       following endoscope reinsertion and showed  complete resolution of       luminal narrowing.      Localized moderately erythematous mucosa without bleeding was found in       the gastric antrum. Biopsies were taken with a cold forceps for       Helicobacter pylori testing.      The cardia and gastric fundus were normal on retroflexion.      The examined duodenum was normal. Impression:               - Esophageal mucosal changes consistent with                             eosinophilic esophagitis. Biopsied. Dilated.                           - Erythematous mucosa in the antrum. Biopsied.                           - Normal examined duodenum. Moderate Sedation:      Patient did not receive moderate sedation for this procedure, but       instead received monitored anesthesia care. Recommendation:           - Patient has a contact number available for                            emergencies. The signs and symptoms of potential                            delayed complications were discussed with the                            patient. Return to normal activities tomorrow.                            Written discharge instructions were provided to the                            patient.                           - Resume regular diet.                           - Continue present medications.                           - Await pathology results.                           - Repeat upper endoscopy in 1 year to assess                            disease activity.                           - Will add rantidine 150 mg at bedtime for further  control of acid reflux at night. Procedure Code(s):        --- Professional ---                           [phone], Esophagogastroduodenoscopy, flexible,                            transoral; with transendoscopic balloon dilation of                            esophagus (less than 30 mm diameter)                           43239, Esophagogastroduodenoscopy, flexible,                            transoral; with biopsy, single or multiple Diagnosis Code(s):        --- Professional ---                           K22.8, Other specified diseases of esophagus                           K31.89, Other diseases of stomach and duodenum                           R13.10, Dysphagia, unspecified                           B28.4, Eosinophilic esophagitis CPT copyright 2017 [employer]. All rights  reserved. The codes documented in this report are preliminary and upon coder review may  be revised to meet current compliance requirements. Ronnette Juniper, MD 06/10/2018 9:43:52 AM This report has been signed electronically. Number of Addenda: 0

## 2018-06-10 NOTE — Brief Op Note (Signed)
06/10/2018  9:46 AM  PATIENT:  Bobby Cole  52 y.o. male  PRE-OPERATIVE DIAGNOSIS:  Eosinophilic esophagitis, screen for colon cancer  POST-OPERATIVE DIAGNOSIS:  esophageal dilation  PROCEDURE:  Procedure(s): ESOPHAGOGASTRODUODENOSCOPY (EGD) (N/A) COLONOSCOPY (N/A) BIOPSY ESOPHAGEAL DILITATION (N/A)  SURGEON:  Surgeon(s) and Role:    Ronnette Juniper, MD - Primary  PHYSICIAN ASSISTANT:   ASSISTANTS: Cleda Daub, RN,Guillame Awaka, Tech  ANESTHESIA:   MAC  EBL:  minimal   BLOOD ADMINISTERED:none  DRAINS: none   LOCAL MEDICATIONS USED:  NONE  SPECIMEN:  Biopsy / Limited Resection  DISPOSITION OF SPECIMEN:  PATHOLOGY  COUNTS:  YES  TOURNIQUET:  * No tourniquets in log *  DICTATION: .Dragon Dictation  PLAN OF CARE: Discharge to home after PACU  PATIENT DISPOSITION:  PACU - hemodynamically stable.   Delay start of Pharmacological VTE agent (>24hrs) due to surgical blood loss or risk of bleeding: no

## 2018-06-10 NOTE — Interval H&P Note (Signed)
History and Physical Interval Note: 79/IONG with eosinophilic esophagitis for EGD with possible dilation and biopsies and colonoscopy for screening.  06/10/2018 9:08 AM  Bobby Cole  has presented today for EGD and colonoscopy, with the diagnosis of Eosinophilic esophagitis, screen for colon cancer  The various methods of treatment have been discussed with the patient and family. After consideration of risks, benefits and other options for treatment, the patient has consented to  Procedure(s): ESOPHAGOGASTRODUODENOSCOPY (EGD) (N/A) COLONOSCOPY (N/A) as a surgical intervention .  The patient's history has been reviewed, patient examined, no change in status, stable for surgery.  I have reviewed the patient's chart and labs.  Questions were answered to the patient's satisfaction.     Ronnette Juniper

## 2018-06-12 ENCOUNTER — Encounter (HOSPITAL_COMMUNITY): Payer: Self-pay | Admitting: Gastroenterology

## 2018-07-07 IMAGING — CT CT SHOULDER*L* W/O CM
3 of 4 series · 8 of 14 positions shown, 9 images · non-contrast
Comparison: None.

CLINICAL DATA: Left shoulder pain for couple months. Unable to
raise the arm. Left shoulder pain for a couple of months, worse x 4
wks.

EXAM:
CT OF THE UPPER LEFT EXTREMITY WITHOUT CONTRAST
TECHNIQUE: Multidetector CT imaging of the upper left extremity was performed
according to the standard protocol.

[Series 103: cor soft · sagittal · 0.62mm/px · 2 of 124 slices shown]
[im 42/124  soft-tissue]
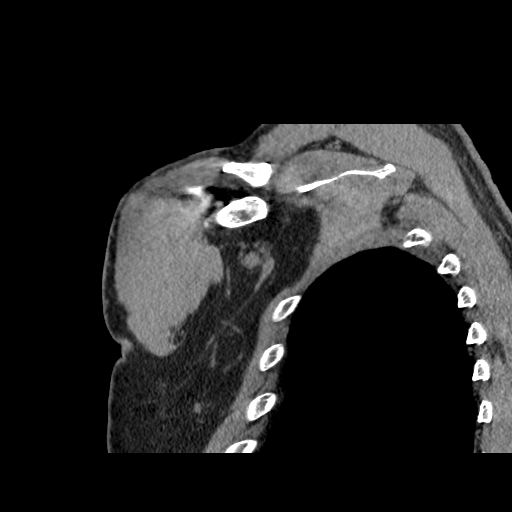
[im 83/124  soft-tissue]
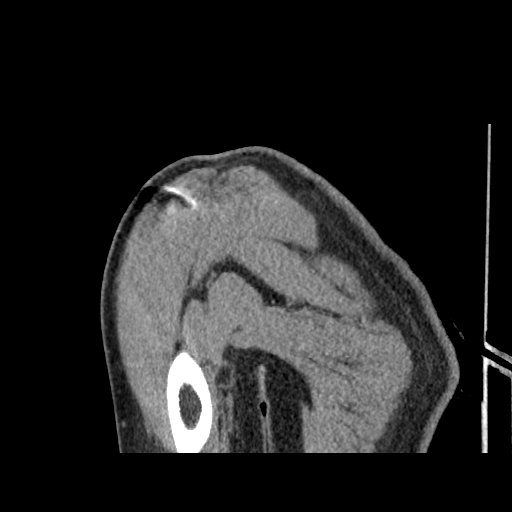

[Series 104: sag soft · oblique · 0.62mm/px · 3 of 136 slices shown]
[im 34/136  soft-tissue]
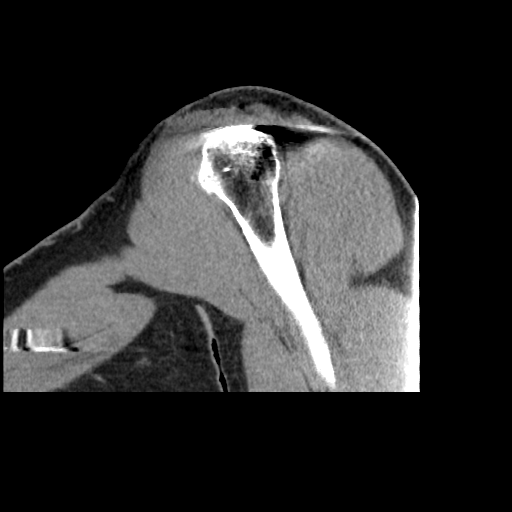
[im 68/136  soft-tissue]
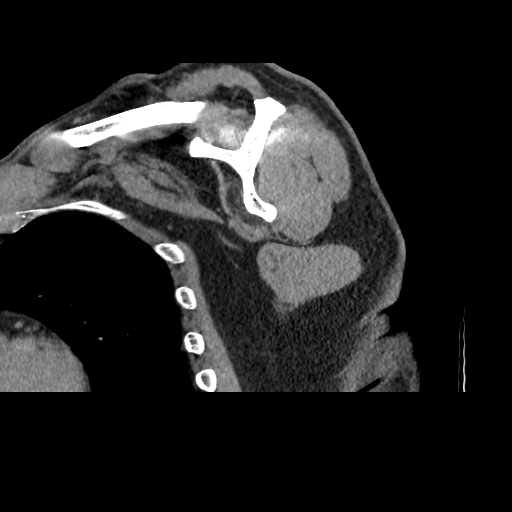
[im 102/136  soft-tissue]
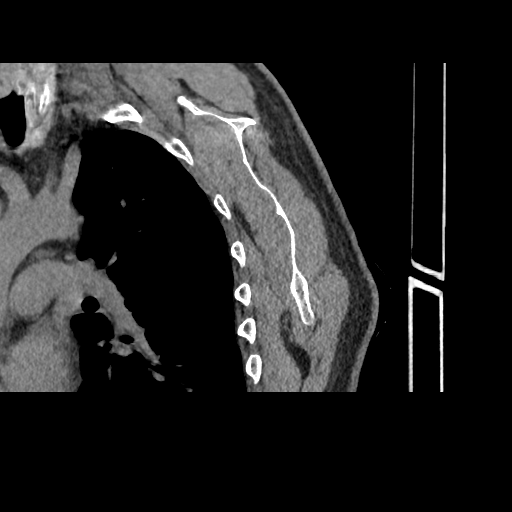

[Series 302: soft (id) · axial · 0.62mm/px · z∈[-228,-24]mm · 3 of 83 slices shown, 4 images]
[im 1/83  soft-tissue]
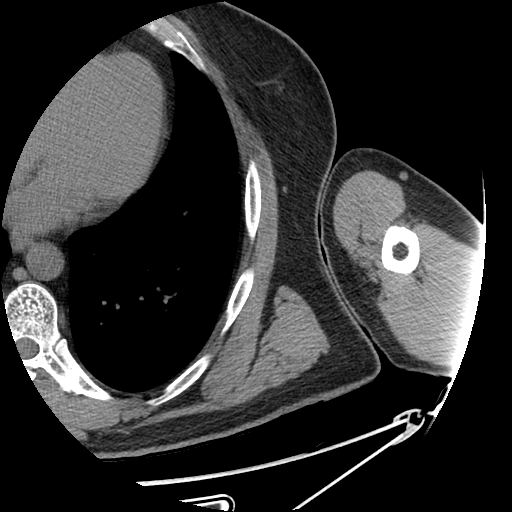
[im 1/83  bone]
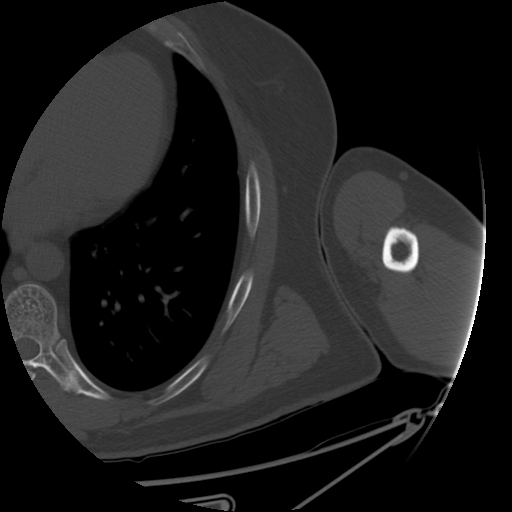
[im 42/83  bone]
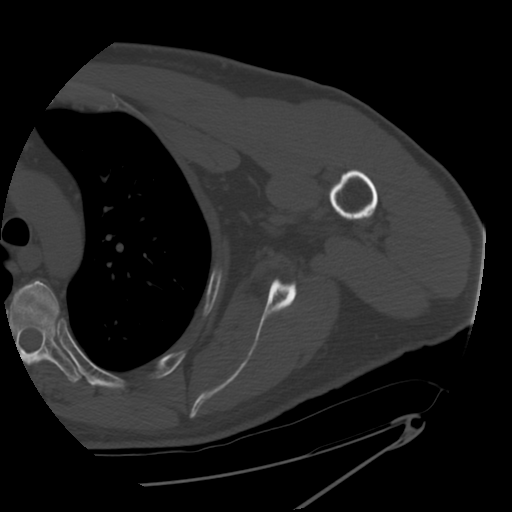
[im 83/83  bone]
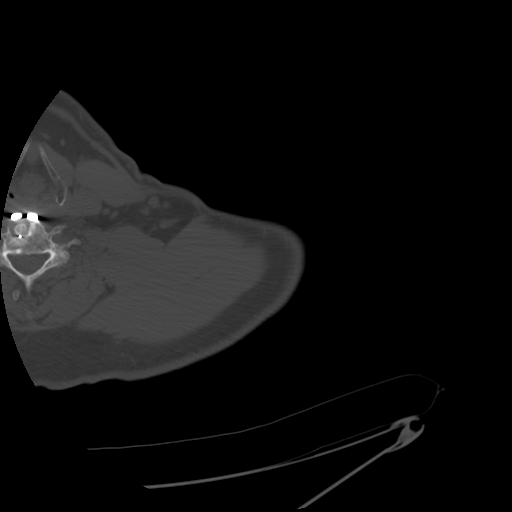

[8 of 14 positions shown; findings below may reference images not displayed]

FINDINGS: Bones/Joint/Cartilage

No acute fracture or dislocation. Mild osteoarthritis of the right
glenohumeral joint. Partial hemiarthroplasty involving the superior
humeral head articular surface. No hardware failure or complication.

No acute fracture or dislocation. No aggressive lytic or sclerotic
osseous lesion.

Normal acromioclavicular joint. Normal alignment. No joint effusion.

Ligaments

Ligaments are suboptimally evaluated by CT.

Muscles and Tendons
Moderate muscle atrophy of the subscapularis muscle. No other muscle
atrophy.

Soft tissue
No fluid collection or hematoma. No soft tissue mass. Visualized
left lung is clear.
IMPRESSION: 1.  No acute osseous injury of the left shoulder.
2. Mild osteoarthritis of the right glenohumeral joint. Partial
hemiarthroplasty involving the superior humeral head articular
surface. No hardware failure or complication.

## 2018-08-18 IMAGING — DX DG SHOULDER 1V*L*
1 series · 2 of 2 positions shown · non-contrast
Comparison: CT left shoulder March 26, 2017

CLINICAL DATA: Status post total shoulder replacement

EXAM:
LEFT SHOULDER - 1 VIEW

[Series 1: shoulder · 0.14mm/px · 2 of 2 slices shown]
[im 1/2]
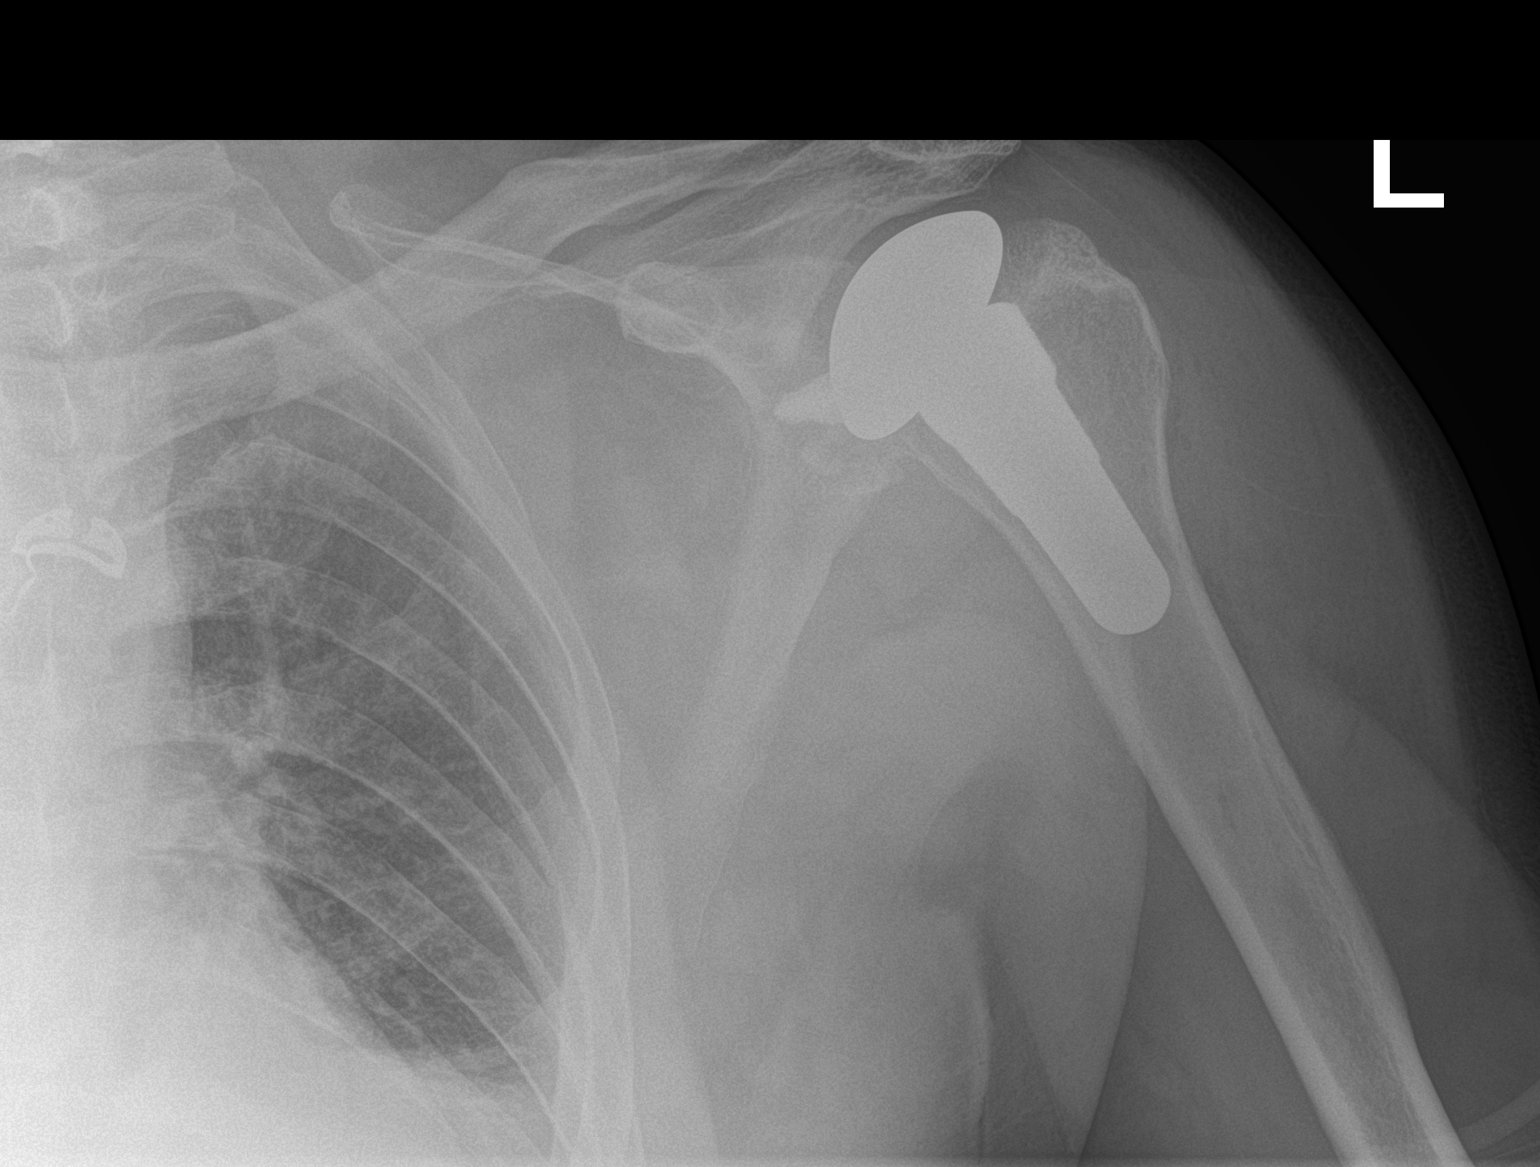
[im 2/2]
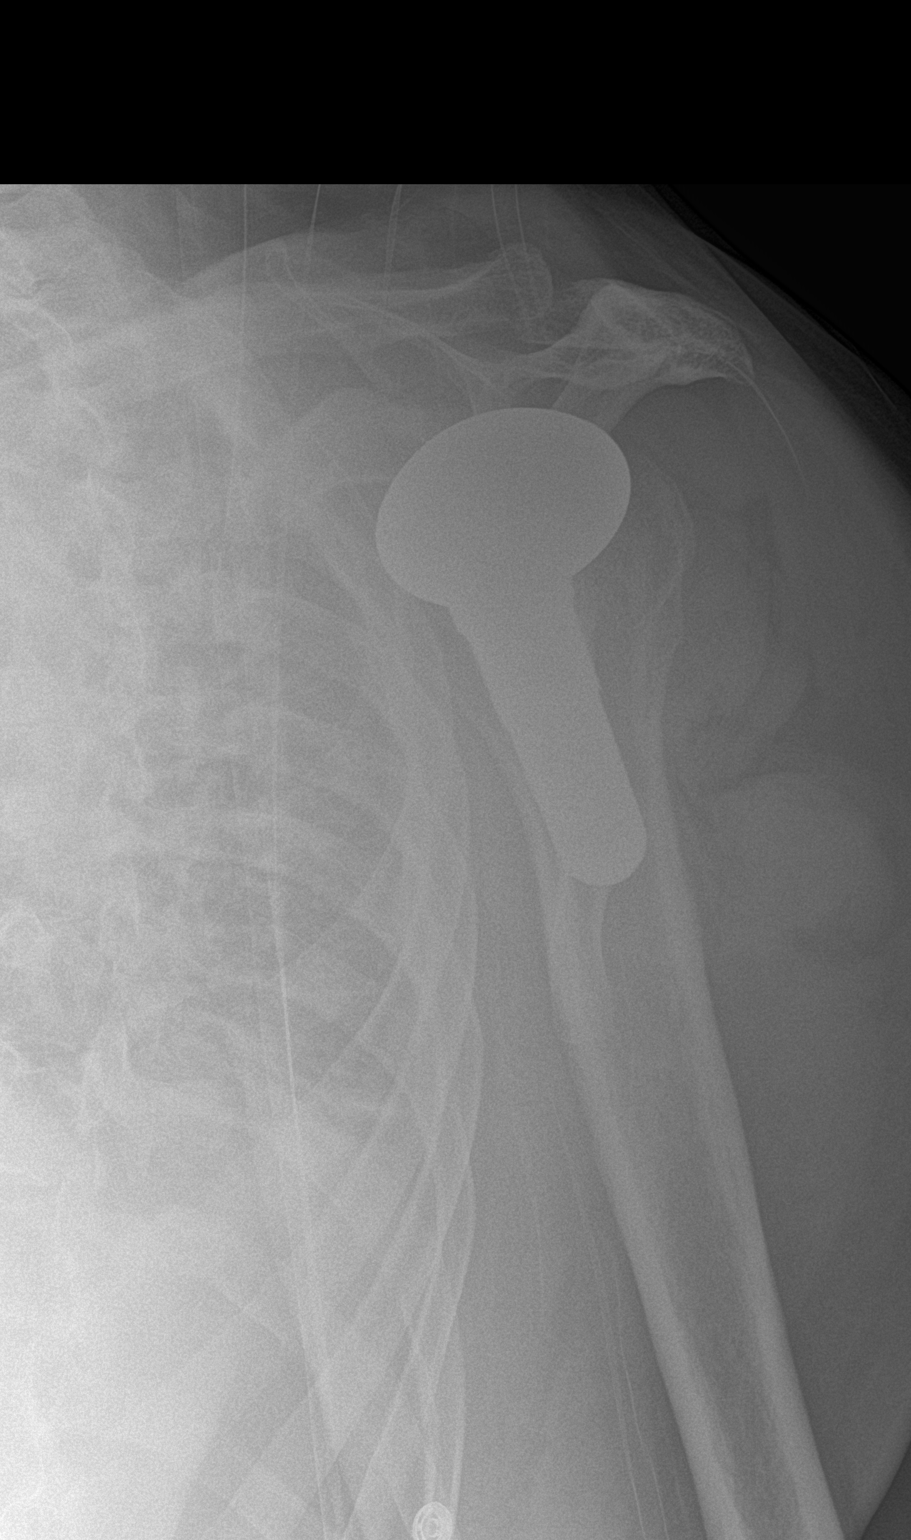

[2 of 2 positions shown; findings below may reference images not displayed]

FINDINGS: Frontal view obtained. There is a total shoulder replacement on the
left with prosthetic components appearing well-seated. No fracture
or dislocation. There is mild narrowing of the acromioclavicular
joint. No erosive change. There is atelectasis in the left lung
base.
IMPRESSION: Total shoulder replacement with prosthetic component well-seated on
frontal view. There is a degree of osteoarthritic change in the
acromioclavicular joint. No fracture or dislocation. There is
atelectasis in the left lung base.

## 2023-02-21 ENCOUNTER — Encounter: Admit: 2023-02-21 | Discharge: 2023-02-21

## 2023-03-01 ENCOUNTER — Encounter: Admit: 2023-03-01 | Discharge: 2023-03-01

## 2023-03-07 ENCOUNTER — Encounter: Admit: 2023-03-07 | Discharge: 2023-03-07

## 2023-03-21 ENCOUNTER — Encounter: Admit: 2023-03-21 | Discharge: 2023-03-21 | Payer: BC Managed Care – PPO

## 2023-03-21 ENCOUNTER — Ambulatory Visit: Admit: 2023-03-21 | Discharge: 2023-03-22 | Payer: BC Managed Care – PPO

## 2023-03-21 ENCOUNTER — Ambulatory Visit: Admit: 2023-03-21 | Discharge: 2023-03-21 | Payer: BC Managed Care – PPO

## 2023-03-21 DIAGNOSIS — R1319 Other dysphagia: Secondary | ICD-10-CM

## 2023-03-21 DIAGNOSIS — K200 Eosinophilic esophagitis: Secondary | ICD-10-CM

## 2023-03-21 DIAGNOSIS — K222 Esophageal obstruction: Secondary | ICD-10-CM

## 2023-03-21 DIAGNOSIS — C61 Malignant neoplasm of prostate: Secondary | ICD-10-CM

## 2023-03-21 DIAGNOSIS — K219 Gastro-esophageal reflux disease without esophagitis: Secondary | ICD-10-CM

## 2023-03-21 DIAGNOSIS — I749 Embolism and thrombosis of unspecified artery: Secondary | ICD-10-CM

## 2023-03-21 NOTE — Progress Notes
Spoke with patient, verbalized understanding of instructions. Posted to MyChart.    You are scheduled for a EGD with Dilation on March 29, 2023 with Dr. Keenan Bachelor.     You must ARRIVE AND CHECK IN at Admissions by 7:00 AM    Your procedure is scheduled to start at: 8:30 AM    Check in to Admissions at:  [facility]  [address]  [place], [place]., [address]       If you need to reschedule or if you have questions call [phone] Option #2.    Procedure questions after 4:45pm, the day before your procedure- All questions about prep and procedure should be answered by the GI Fellow on call. To page the GI Fellow, call the main hospital number at: [phone] and ask the operator to page the GI Fellow on call.    EGD (ESOPHAGOGASTRODUODENOSCOPY) PREP      Upper GI endoscopy allows healthcare providers to look directly into the beginning of your gastrointestinal(GI) tract.  The esophagus, stomach, and duodenum (first part of the small intestine) make up the upper GI tract.      1 Week Prior:  If you are taking a weekly GLP-1 agonist (ex. Ozempic, [place], [place], [place], Zepbound) do not take this medication the week prior.     5 Days Prior:  Check with your prescribing physician for instructions about stopping your blood thinner.  Examples of blood thinners are Aleve, Aspirin. Coumadin, Eliquis, hold prior to procedure for two (2) days if okay with your doctor, Ibuprofen, Naproxen, Plavix, and Xarelto.  Do not give yourself a Lovenox injection the morning of the test. Lovenox injections may be taken as usual through the day before your test.    Day of Exam:   June 28th  Do not eat or drink anything after midnight the night before your exam. However, if your exam is in the afternoon you may drink clear liquids only up until (4) hours before your scheduled procedure time.  After this, you should have nothing by mouth.  This includes GUM or CANDY.   Chewing tobacco must be stopped  (6) hours before your scheduled procedure.   If you have an early morning test, take ONLY your essential morning medications (heart, blood pressure, seizure, etc.) with a small sip of water.   You will be sedated for the procedure. A responsible adult must drive you home (no Benedetto Goad, taxis, or buses are permitted). If you do not have a driver we will be unable to do the test.   If you are taking a daily GLP-1 agonist (ex. Victoza, Saxenda, Rybelsus)   do not take this medication the day of the test.   You will be here for (3-4) hours from arrival time.   You will not be able to return to work the same day.   Please bring a list of your current medications and the dosages with you.     The Procedure:  You will lie on the endoscopy table. Usually patients lie on the left side.  You will be monitored and given oxygen.   You are given sedation (relaxing) medication through an intravenous (IV) line.  The healthcare provider will put the endoscope in your mouth and down your esophagus. It is thinner than most pieces of food that you swallow.  It will not affect your breathing. The medicine helps keep you from gagging.   Air is inserted to expand your GI tract. It can make  you burp.  During the procedure, the healthcare provider can take biopsies (tissue samples), remove abnormalities such as polyps, or treat abnormalities though a variety of devices placed through the endoscope. You will not feel this.   The endoscope carries images of your upper GI tract to a video screen.  An adult must drive you home.

## 2023-03-21 NOTE — Progress Notes
Telehealth Visit Note    Date of Service: 03/21/2023    Subjective:           Frank Leonard is a 57 y.o. male.    History of Present Illness  This is a 57 year old male who is been referred to [place] for history of eosinophilic esophagitis, dysphagia and esophageal stricture.  Patient has been complaining of dysphagia for the last 20 years.  He was diagnosed with eosinophilic esophagitis after a few years.  He was started on fluticasone inhalers to be followed.  This helped his symptoms some.  He was needing EGD for dilation at least 2-3 times per year.  Subsequently he moved to [place] from 20 14-20 18 when he did not take any treatment.  After return to the [place] he was in [place] from 20 18-20 19.  He underwent dilations during this time.  Subsequently he felt good for 7 years.  However most recently for about the last 3 to 4 months the dysphagia has worsened.  He also had an episode of food bolus impaction that lasted for about 45 minutes and resolved spontaneously.  At present he has also difficulty drinking liquids.  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  Balloon dilation to 8 mm was performed.  Biopsies from the proximal esophagus showed 35 eosinophils per high-power field.  Currently he has been on budesonide 2 mg twice daily but has been only taking it once a day.  Dupixent has been prescribed and he is going to start this tomorrow.  His weight is stable.  He also has history of prostate cancer.    Social history-he is married, does not have any children, does not smoke, drinks alcohol occasionally and works at [employer] and is noncontributory.           Comprehensive 10 point ROS taken, and otherwise negative except as above.    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    Allergies   Allergen Reactions    Fish Containing Products ANAPHYLAXIS    Tree Nuts ANAPHYLAXIS    Codeine NAUSEA ONLY       There are no problems to display for this patient.        Objective:          budesonide (EOHILIA) 2 mg/10 mL suspension in packet Take 10 mL by mouth twice daily.    DUPIXENT PEN 300 mg/2 mL injectable PEN  ELIQUIS 5 mg tablet Take one tablet by mouth twice daily.    testosterone cypionate (DEPO-TESTOSTERONE) 200 mg/mL injection INJECT 1/2 (ONE-HALF) ML (CC) INTRAMUSCULARLY ONCE A WEEK          Telehealth Patient Reported Vitals       Row Name 03/21/23 0803                Weight: 113.4 kg (250 lb)  per pt        Height: 180.3 cm (5' 11)  per pt        Pain Score: Zero                      Telehealth Body Mass Index: 34.87 at 03/21/2023  8:09 AM    Physical Exam  Deferred as this was a telehealth visit       Assessment and Plan:    This is a 57 year old male who is been referred to [place] for history of eosinophilic esophagitis, dysphagia and esophageal stricture.  Patient has been complaining of dysphagia for the last 20 years.  He was diagnosed with eosinophilic esophagitis after a few years.  He was started on fluticasone inhalers to be followed.  This helped his symptoms some.  He was needing EGD for dilation at least 2-3 times per year.  Subsequently he moved to [place] from 20 14-20 18 when he did not take any treatment.  After return to the [place] he was in [place] from 20 18-20 19.  He underwent dilations during this time.  Subsequently he felt good for 7 years.  However most recently for about the last 3 to 4 months the dysphagia has worsened.  He also had an episode of food bolus impaction that lasted for about 45 minutes and resolved spontaneously.  At present he has also difficulty drinking liquids.  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  Balloon dilation to 8 mm was performed.  Biopsies from the proximal esophagus showed 35 eosinophils per high-power field.  Currently he has been on budesonide 2 mg twice daily but has been only taking it once a day.  Dupixent has been prescribed and he is going to start this tomorrow.  His weight is stable.  He also has history of prostate cancer.    We discussed with the patient regarding management of eosinophilic esophagitis.  I recommend that he take small bites, chew food well and swallow 1 bite at a time.  He should come to the ER if he has an episode of food bolus impaction.    Will schedule him for upper endoscopy for sequential dilation of the upper esophageal stricture.  I explained to him that he may need multiple dilations.  After initial dilation he can get subsequent dilations locally.  I am interested in seeing how he responds to the Dupixent.  We also discussed in short about 6 food illumination diet.  At present I will wait to see how he responds to serial dilation as well as the Dupixent.  Thank you for allowing [place] to participate in his care and please feel free to call us if you have any questions.                           46 minutes spent on this patient's encounter with counseling and coordination of care taking >50% of the visit.

## 2023-03-28 ENCOUNTER — Encounter: Admit: 2023-03-28 | Discharge: 2023-03-28 | Payer: BC Managed Care – PPO

## 2023-03-28 NOTE — Telephone Encounter
Pre procedure review  EGD/dilation - severe esophageal stricture  Referring: Domingo Cocking  Seen in clinic    EGD 01/04/2023 (media)              Esophagram 01/23/2023 (media)

## 2023-03-29 ENCOUNTER — Encounter: Admit: 2023-03-29 | Discharge: 2023-03-29 | Payer: BC Managed Care – PPO

## 2023-03-29 ENCOUNTER — Ambulatory Visit: Admit: 2023-03-29 | Discharge: 2023-03-29 | Payer: BC Managed Care – PPO

## 2023-03-29 DIAGNOSIS — C61 Malignant neoplasm of prostate: Secondary | ICD-10-CM

## 2023-03-29 DIAGNOSIS — I749 Embolism and thrombosis of unspecified artery: Secondary | ICD-10-CM

## 2023-03-29 DIAGNOSIS — K219 Gastro-esophageal reflux disease without esophagitis: Secondary | ICD-10-CM

## 2023-03-29 MED ORDER — PROPOFOL 10 MG/ML IV EMUL 20 ML (INFUSION)(AM)(OR)
INTRAVENOUS | 0 refills | Status: DC
Start: 2023-03-29 — End: 2023-03-29
  Administered 2023-03-29: 13:00:00 140 ug/kg/min via INTRAVENOUS

## 2023-03-29 MED ORDER — LIDOCAINE (PF) 20 MG/ML (2 %) IJ SOLN
INTRAVENOUS | 0 refills | Status: DC
Start: 2023-03-29 — End: 2023-03-29

## 2023-03-29 MED ORDER — LACTATED RINGERS IV SOLP
INTRAVENOUS | 0 refills | Status: DC
Start: 2023-03-29 — End: 2023-03-29

## 2023-03-29 MED ORDER — FENTANYL CITRATE (PF) 50 MCG/ML IJ SOLN
INTRAVENOUS | 0 refills | Status: DC
Start: 2023-03-29 — End: 2023-03-29

## 2023-03-29 NOTE — Anesthesia Post-Procedure Evaluation
Post-Anesthesia Evaluation    Name: Frank Leonard      MRN: [id number]     DOB: [birth year]     Age: 57 y.o.     Sex: male   __________________________________________________________________________     Procedure Information       Anesthesia Start Date/Time: 03/29/23 0809    Procedure: ESOPHAGOGASTRODUODENOSCOPY WITH DILATION GASTRIC/ DUODENAL STRICTURE - FLEXIBLE    Location: ENDO 2 / ENDO/GI    Surgeons: Vertell Novak, MD            Post-Anesthesia Vitals  BP: 128/84 (06/28 0851)  Pulse: 75 (06/28 0851)  Respirations: 13 PER MINUTE (06/28 0851)  SpO2: 97 % (06/28 0851)  O2 Device: None (Room air) (06/28 0851)   Vitals Value Taken Time   BP 128/84 03/29/23 0851   Temp     Pulse 75 03/29/23 0851   Respirations 13 PER MINUTE 03/29/23 0851   SpO2 97 % 03/29/23 0851   O2 Device None (Room air) 03/29/23 0851   ABP     ART BP           Post Anesthesia Evaluation Note    Evaluation location: Pre/Post  Patient participation: recovered; patient participated in evaluation  Level of consciousness: alert  Pain management: adequate    Hydration: normovolemia  Temperature: 36.0?C - 38.4?C  Airway patency: adequate    Perioperative Events       Post-op nausea and vomiting: no PONV    Postoperative Status  Cardiovascular status: hemodynamically stable  Respiratory status: spontaneous ventilation        Perioperative Events  There were no known complications for this encounter.

## 2023-03-31 ENCOUNTER — Encounter: Admit: 2023-03-31 | Discharge: 2023-03-31 | Payer: BC Managed Care – PPO

## 2023-03-31 DIAGNOSIS — K219 Gastro-esophageal reflux disease without esophagitis: Secondary | ICD-10-CM

## 2023-03-31 DIAGNOSIS — I749 Embolism and thrombosis of unspecified artery: Secondary | ICD-10-CM

## 2023-03-31 DIAGNOSIS — C61 Malignant neoplasm of prostate: Secondary | ICD-10-CM
